# Patient Record
Sex: Male | Born: 1960 | Race: White | Hispanic: No | Marital: Single | Smoking: Current every day smoker
Health system: Southern US, Community
[De-identification: ages and names within clinical notes are randomized; demographics above are authoritative.]

## PROBLEM LIST (undated history)

## (undated) DIAGNOSIS — E7800 Pure hypercholesterolemia, unspecified: Secondary | ICD-10-CM

## (undated) DIAGNOSIS — I639 Cerebral infarction, unspecified: Secondary | ICD-10-CM

## (undated) HISTORY — PX: INCISE AND DRAIN ABCESS: PRO64

---

## 2001-04-24 ENCOUNTER — Emergency Department (HOSPITAL_COMMUNITY): Admission: EM | Admit: 2001-04-24 | Discharge: 2001-04-24 | Payer: Self-pay | Admitting: Emergency Medicine

## 2005-06-19 ENCOUNTER — Emergency Department (HOSPITAL_COMMUNITY): Admission: EM | Admit: 2005-06-19 | Discharge: 2005-06-19 | Payer: Self-pay | Admitting: Emergency Medicine

## 2017-01-31 ENCOUNTER — Encounter (HOSPITAL_COMMUNITY): Payer: Self-pay | Admitting: Emergency Medicine

## 2017-01-31 ENCOUNTER — Emergency Department (HOSPITAL_COMMUNITY)
Admission: EM | Admit: 2017-01-31 | Discharge: 2017-01-31 | Disposition: A | Discharge status: Home / Self Care | Payer: Self-pay | Attending: Emergency Medicine | Admitting: Emergency Medicine

## 2017-01-31 DIAGNOSIS — K4090 Unilateral inguinal hernia, without obstruction or gangrene, not specified as recurrent: Secondary | ICD-10-CM

## 2017-01-31 DIAGNOSIS — K4091 Unilateral inguinal hernia, without obstruction or gangrene, recurrent: Secondary | ICD-10-CM | POA: Insufficient documentation

## 2017-01-31 DIAGNOSIS — F17210 Nicotine dependence, cigarettes, uncomplicated: Secondary | ICD-10-CM | POA: Insufficient documentation

## 2017-01-31 LAB — URINALYSIS, ROUTINE W REFLEX MICROSCOPIC
Bilirubin Urine: NEGATIVE
Glucose, UA: NEGATIVE mg/dL
Hgb urine dipstick: NEGATIVE
KETONES UR: NEGATIVE mg/dL
LEUKOCYTES UA: NEGATIVE
NITRITE: NEGATIVE
PROTEIN: NEGATIVE mg/dL
Specific Gravity, Urine: 1.003 — ABNORMAL LOW (ref 1.005–1.030)
pH: 6 (ref 5.0–8.0)

## 2017-01-31 MED ORDER — NAPROXEN 500 MG PO TABS: 500.0000 mg | ORAL_TABLET | Freq: Two times a day (BID) | ORAL | 0 refills | Status: DC

## 2017-01-31 NOTE — ED Notes (Signed)
Pt made aware to return if symptoms worsen or if any life threatening symptoms occur.   

## 2017-01-31 NOTE — ED Triage Notes (Signed)
PT states he started having right groin pain x4 weeks ago after helping a friend move. PT states he was to follow up with the health department and went there this am and was told to come to ED to rule out hernia. PT also states increased urinary freq x2 weeks.

## 2017-02-01 NOTE — ED Provider Notes (Signed)
[facility] DEPT Provider Note   CSN: [id number] Arrival date & time: 01/31/17  0848     History   Chief Complaint Chief Complaint  Patient presents with  . Groin Pain    HPI Jonathon Yates is a 56 y.o. male presenting with intermittent pain and swelling in his right groin which started after helping a friend move, had lifted several heavy objects.  His pain is aching, sometimes sharp and is worsened with coughing, sneezing, bending over, with swelling being intermittent and also worse when standing.  He was seen by the health department and referred here to assess for a possible hernia.  He denies penile or scrotal pain, denies hematuria, dysuria, but does endorse increased urinary frequency for the past 3 weeks.  No n/v/d, fevers or back pain.  Rest improves the symptoms.  The history is provided by the patient.    History reviewed. No pertinent past medical history.  There are no active problems to display for this patient.   Past Surgical History:  Procedure Laterality Date  . INCISE AND DRAIN ABCESS         Home Medications    Prior to Admission medications   Medication Sig Start Date End Date Taking? Authorizing Provider  Aspirin-Salicylamide-Caffeine (BC HEADACHE PO) Take 1-2 packets by mouth daily as needed (headache).   Yes Historical Provider, MD  naproxen (NAPROSYN) 500 MG tablet Take 1 tablet (500 mg total) by mouth 2 (two) times daily. 01/31/17   Evalee Jefferson, PA-C    Family History No family history on file.  Social History Social History  Substance Use Topics  . Smoking status: Current Every Day Smoker    Packs/day: 0.50    Types: Cigarettes  . Smokeless tobacco: Never Used  . Alcohol use Yes     Comment: occ     Allergies   Patient has no known allergies.   Review of Systems Review of Systems  Constitutional: Negative for fever.  HENT: Negative for congestion and sore throat.   Eyes: Negative.   Respiratory: Negative for chest  tightness and shortness of breath.   Cardiovascular: Negative for chest pain.  Gastrointestinal: Positive for abdominal distention and abdominal pain. Negative for constipation, nausea and vomiting.  Genitourinary: Positive for frequency. Negative for difficulty urinating, dysuria and hematuria.  Musculoskeletal: Negative for arthralgias, joint swelling and neck pain.  Skin: Negative.  Negative for rash and wound.  Neurological: Negative for dizziness, weakness, light-headedness, numbness and headaches.  Psychiatric/Behavioral: Negative.      Physical Exam Updated Vital Signs BP (!) 143/88 (BP Location: Left Arm)   Pulse (!) 58   Temp 97.6 F (36.4 C) (Oral)   Resp 16   Ht 5\' 9"  (1.753 m)   Wt 80.3 kg   SpO2 99%   BMI 26.14 kg/m   Physical Exam  Constitutional: He appears well-developed and well-nourished.  HENT:  Head: Normocephalic and atraumatic.  Eyes: Conjunctivae are normal.  Neck: Normal range of motion.  Cardiovascular: Normal rate, regular rhythm, normal heart sounds and intact distal pulses.   Pulmonary/Chest: Effort normal and breath sounds normal. He has no wheezes.  Abdominal: Soft. Bowel sounds are normal. There is no tenderness. A hernia is present. Hernia confirmed positive in the right inguinal area.  Right direct inguinal hernia which is easily reducible with gentle pressure.  Genitourinary: Testes normal and penis normal.  Musculoskeletal: Normal range of motion.  Neurological: He is alert.  Skin: Skin is warm and dry.  Psychiatric: He  has a normal mood and affect.  Nursing note and vitals reviewed.  Chaperone was present during exam.    ED Treatments / Results  Labs (all labs ordered are listed, but only abnormal results are displayed) Labs Reviewed  URINALYSIS, ROUTINE W REFLEX MICROSCOPIC - Abnormal; Notable for the following:       Result Value   Color, Urine STRAW (*)    Specific Gravity, Urine 1.003 (*)    All other components within  normal limits    EKG  EKG Interpretation None       Radiology No results found.  Procedures Procedures (including critical care time)  Medications Ordered in ED Medications - No data to display   Initial Impression / Assessment and Plan / ED Course  I have reviewed the triage vital signs and the nursing notes.  Pertinent labs & imaging results that were available during my care of the patient were reviewed by me and considered in my medical decision making (see chart for details).     Pt referred to gen surg.  No acute findings today, easily reducible right direct inguinal hernia without evidence of incarceration.  Discussed strict return precautions.    The patient appears reasonably screened and/or stabilized for discharge and I doubt any other medical condition or other [facility] requiring further screening, evaluation, or treatment in the ED at this time prior to discharge.   Final Clinical Impressions(s) / ED Diagnoses   Final diagnoses:  Inguinal hernia of right side without obstruction or gangrene    New Prescriptions Discharge Medication List as of 01/31/2017 11:07 AM    START taking these medications   Details  naproxen (NAPROSYN) 500 MG tablet Take 1 tablet (500 mg total) by mouth 2 (two) times daily., Starting Mon 01/31/2017, Print         Evalee Jefferson, PA-C 02/01/17 [place], DO 02/04/17 1658

## 2017-02-17 ENCOUNTER — Encounter: Payer: Self-pay | Admitting: General Surgery

## 2017-02-17 ENCOUNTER — Ambulatory Visit (INDEPENDENT_AMBULATORY_CARE_PROVIDER_SITE_OTHER): Payer: Self-pay | Admitting: General Surgery

## 2017-02-17 VITALS — BP 122/63 | HR 62 | Temp 98.2°F | Resp 18 | Ht 69.0 in | Wt 197.0 lb

## 2017-02-17 DIAGNOSIS — K4090 Unilateral inguinal hernia, without obstruction or gangrene, not specified as recurrent: Secondary | ICD-10-CM

## 2017-02-17 NOTE — Patient Instructions (Signed)

## 2017-02-17 NOTE — H&P (Signed)
Jonathon Yates; [id number]; 1961/02/14   HPI Patient is a 56 year old white male who was referred by the emergency room for evaluation treatment of her right inguinal hernia. He was moving a refrigerator with a dolly and had a sudden onset of right groin pain. He noticed a lump that was present. The lump comes and goes. It is made worse with straining. It does reduce when he lies back flat. His pain level is 8 out of 10 when it is sticking out. The pain radiates to the right inner thigh and right testicle. No past medical history on file.       Past Surgical History:  Procedure Laterality Date  . INCISE AND DRAIN ABCESS      No family history on file.        Current Outpatient Prescriptions on File Prior to Visit  Medication Sig Dispense Refill  . Aspirin-Salicylamide-Caffeine (BC HEADACHE PO) Take 1-2 packets by mouth daily as needed (headache).    . naproxen (NAPROSYN) 500 MG tablet Take 1 tablet (500 mg total) by mouth 2 (two) times daily. 30 tablet 0   No current facility-administered medications on file prior to visit.     No Known Allergies      History  Alcohol Use  . Yes    Comment: occ        History  Smoking Status  . Current Every Day Smoker  . Packs/day: 0.50  . Types: Cigarettes  Smokeless Tobacco  . Never Used    Review of Systems  Constitutional: Negative.   HENT: Positive for sinus pain.   Eyes: Negative.   Respiratory: Negative.   Cardiovascular: Negative.   Gastrointestinal: Negative.   Genitourinary: Negative.   Musculoskeletal: Positive for back pain, joint pain and neck pain.  Skin: Negative.   Neurological: Negative.   Endo/Heme/Allergies: Negative.   Psychiatric/Behavioral: Negative.     Objective      Vitals:   02/17/17 1032  BP: 122/63  Pulse: 62  Resp: 18  Temp: 98.2 F (36.8 C)    Physical Exam  Constitutional: He is oriented to person, place, and time and well-developed, well-nourished, and in  no distress.  HENT:  Head: Normocephalic and atraumatic.  Neck: Normal range of motion. Neck supple.  Cardiovascular: Normal rate, regular rhythm and normal heart sounds.   No murmur heard. Pulmonary/Chest: Effort normal and breath sounds normal. He has no wheezes. He has no rales.  Abdominal: Soft. Bowel sounds are normal. He exhibits no distension. There is no tenderness.  Easily reducible right inguinal hernia is present.  Genitourinary:  Genitourinary Comments: Genitourinary examination is within normal limits.  Neurological: He is alert and oriented to person, place, and time.  Skin: Skin is warm and dry.  Vitals reviewed.  ER notes reviewed.  Assessment  Right inguinal hernia Plan   Scheduled for right inguinal herniorrhaphy with mesh on 02/25/2017. The risks and benefits of the procedure including bleeding, infection, mesh use, and the possibility of recurrence of the hernia were fully explained to the patient, who gave informed consent.

## 2017-02-17 NOTE — Progress Notes (Signed)
Jonathon Yates; [id number]; 09/09/1961   HPI Patient is a 56 year old white male who was referred by the emergency room for evaluation treatment of her right inguinal hernia. He was moving a refrigerator with a dolly and had a sudden onset of right groin pain. He noticed a lump that was present. The lump comes and goes. It is made worse with straining. It does reduce when he lies back flat. His pain level is 8 out of 10 when it is sticking out. The pain radiates to the right inner thigh and right testicle. No past medical history on file.  Past Surgical History:  Procedure Laterality Date  . INCISE AND DRAIN ABCESS      No family history on file.  Current Outpatient Prescriptions on File Prior to Visit  Medication Sig Dispense Refill  . Aspirin-Salicylamide-Caffeine (BC HEADACHE PO) Take 1-2 packets by mouth daily as needed (headache).    . naproxen (NAPROSYN) 500 MG tablet Take 1 tablet (500 mg total) by mouth 2 (two) times daily. 30 tablet 0   No current facility-administered medications on file prior to visit.     No Known Allergies  History  Alcohol Use  . Yes    Comment: occ    History  Smoking Status  . Current Every Day Smoker  . Packs/day: 0.50  . Types: Cigarettes  Smokeless Tobacco  . Never Used    Review of Systems  Constitutional: Negative.   HENT: Positive for sinus pain.   Eyes: Negative.   Respiratory: Negative.   Cardiovascular: Negative.   Gastrointestinal: Negative.   Genitourinary: Negative.   Musculoskeletal: Positive for back pain, joint pain and neck pain.  Skin: Negative.   Neurological: Negative.   Endo/Heme/Allergies: Negative.   Psychiatric/Behavioral: Negative.     Objective   Vitals:   02/17/17 1032  BP: 122/63  Pulse: 62  Resp: 18  Temp: 98.2 F (36.8 C)    Physical Exam  Constitutional: He is oriented to person, place, and time and well-developed, well-nourished, and in no distress.  HENT:  Head: Normocephalic and  atraumatic.  Neck: Normal range of motion. Neck supple.  Cardiovascular: Normal rate, regular rhythm and normal heart sounds.   No murmur heard. Pulmonary/Chest: Effort normal and breath sounds normal. He has no wheezes. He has no rales.  Abdominal: Soft. Bowel sounds are normal. He exhibits no distension. There is no tenderness.  Easily reducible right inguinal hernia is present.  Genitourinary:  Genitourinary Comments: Genitourinary examination is within normal limits.  Neurological: He is alert and oriented to person, place, and time.  Skin: Skin is warm and dry.  Vitals reviewed.  ER notes reviewed.  Assessment  Right inguinal hernia Plan   Scheduled for right inguinal herniorrhaphy with mesh on 02/25/2017. The risks and benefits of the procedure including bleeding, infection, mesh use, and the possibility of recurrence of the hernia were fully explained to the patient, who gave informed consent.

## 2017-02-18 NOTE — Patient Instructions (Addendum)
Jonathon Yates  02/18/2017     @PREFPERIOPPHARMACY @   Your procedure is scheduled on 02/25/2017.  Report to [facility] at 8:00 A.M.  Call this number if you have problems the morning of surgery:  [phone]   Remember:  Do not eat food or drink liquids after midnight.  Take these medicines the morning of surgery with A SIP OF WATER : none   Do not wear jewelry, make-up or nail polish.  Do not wear lotions, powders, or perfumes, or deoderant.  Do not shave 48 hours prior to surgery.  Men may shave face and neck.  Do not bring valuables to the [facility].  [facility] is not responsible for any belongings or valuables.  Contacts, dentures or bridgework may not be worn into surgery.  Leave your suitcase in the car.  After surgery it may be brought to your room.  For patients admitted to the [facility], discharge time will be determined by your treatment team.  Patients discharged the day of surgery will not be allowed to drive home.   Name and phone number of your driver:   family Special instructions:  n/a  Please read over the following fact sheets that you were given. Care and Recovery After Surgery       Open Hernia Repair, Adult Open hernia repair is a surgical procedure to fix a hernia. A hernia occurs when an internal organ or tissue pushes out through a weak spot in the abdominal wall muscles. Hernias commonly occur in the groin and around the navel. Most hernias tend to get worse over time. Often, surgery is done to prevent the hernia from becoming bigger, uncomfortable, or an emergency. Emergency surgery may be needed if abdominal contents get stuck in the opening (incarcerated hernia) or the blood supply gets cut off (strangulated hernia). In an open repair, an incision is made in the abdomen to perform the surgery. Tell a health care provider about:  Any allergies you have.  All medicines you are taking, including vitamins, herbs, eye drops, creams, and  over-the-counter medicines.  Any problems you or family members have had with anesthetic medicines.  Any blood or bone disorders you have.  Any surgeries you have had.  Any medical conditions you have, including any recent cold or flu symptoms.  Whether you are pregnant or may be pregnant. What are the risks? Generally, this is a safe procedure. However, problems may occur, including:  Long-lasting (chronic) pain.  Bleeding.  Infection.  Damage to the testicle. This can cause shrinking or swelling.  Damage to the bladder, blood vessels, intestine, or nerves near the hernia.  Trouble passing urine.  Allergic reactions to medicines.  Return of the hernia. What happens before the procedure? Staying hydrated  Follow instructions from your health care provider about hydration, which may include:  Up to 2 hours before the procedure - you may continue to drink clear liquids, such as water, clear fruit juice, black coffee, and plain tea. Eating and drinking restrictions  Follow instructions from your health care provider about eating and drinking, which may include:  8 hours before the procedure - stop eating heavy meals or foods such as meat, fried foods, or fatty foods.  6 hours before the procedure - stop eating light meals or foods, such as toast or cereal.  6 hours before the procedure - stop drinking milk or drinks that contain milk.  2 hours before the procedure - stop drinking clear liquids. Medicines   Ask  your health care provider about:  Changing or stopping your regular medicines. This is especially important if you are taking diabetes medicines or blood thinners.  Taking medicines such as aspirin and ibuprofen. These medicines can thin your blood. Do not take these medicines before your procedure if your health care provider instructs you not to.  You may be given antibiotic medicine to help prevent infection. General instructions   You may have blood tests  or imaging studies.  Ask your health care provider how your surgical site will be marked or identified.  If you smoke, do not smoke for at least 2 weeks before your procedure or for as long as told by your health care provider.  Let your health care provider know if you develop a cold or any infection before your surgery.  Plan to have someone take you home from the [facility] or clinic.  If you will be going home right after the procedure, plan to have someone with you for 24 hours. What happens during the procedure?  To reduce your risk of infection:  Your health care team will wash or sanitize their hands.  Your skin will be washed with soap.  Hair may be removed from the surgical area.  An IV tube will be inserted into one of your veins.  You will be given one or more of the following:  A medicine to help you relax (sedative).  A medicine to numb the area (local anesthetic).  A medicine to make you fall asleep (general anesthetic).  Your surgeon will make an incision over the hernia.  The tissues of the hernia will be moved back into place.  The edges of the hernia may be stitched together.  The opening in the abdominal muscles will be closed with stitches (sutures). Or, your surgeon will place a mesh patch made of manmade (synthetic) material over the opening.  The incision will be closed.  A bandage (dressing) may be placed over the incision. The procedure may vary among health care providers and hospitals. What happens after the procedure?  Your blood pressure, heart rate, breathing rate, and blood oxygen level will be monitored until the medicines you were given have worn off.  You may be given medicine for pain.  Do not drive for 24 hours if you received a sedative. This information is not intended to replace advice given to you by your health care provider. Make sure you discuss any questions you have with your health care provider. Document Released:  04/20/2001 Document Revised: 05/14/2016 Document Reviewed: 04/07/2016 Elsevier Interactive Patient Education  2017 [employer].

## 2017-02-21 ENCOUNTER — Ambulatory Visit: Payer: Self-pay | Admitting: Physician Assistant

## 2017-02-21 ENCOUNTER — Encounter: Payer: Self-pay | Admitting: Physician Assistant

## 2017-02-21 ENCOUNTER — Other Ambulatory Visit: Payer: Self-pay | Admitting: Physician Assistant

## 2017-02-21 VITALS — BP 148/68 | HR 66 | Temp 98.1°F | Ht 68.75 in | Wt 196.0 lb

## 2017-02-21 DIAGNOSIS — Z1211 Encounter for screening for malignant neoplasm of colon: Secondary | ICD-10-CM

## 2017-02-21 DIAGNOSIS — Z131 Encounter for screening for diabetes mellitus: Secondary | ICD-10-CM

## 2017-02-21 DIAGNOSIS — F17210 Nicotine dependence, cigarettes, uncomplicated: Secondary | ICD-10-CM | POA: Insufficient documentation

## 2017-02-21 DIAGNOSIS — Z13220 Encounter for screening for lipoid disorders: Secondary | ICD-10-CM

## 2017-02-21 DIAGNOSIS — Z125 Encounter for screening for malignant neoplasm of prostate: Secondary | ICD-10-CM

## 2017-02-21 DIAGNOSIS — R030 Elevated blood-pressure reading, without diagnosis of hypertension: Secondary | ICD-10-CM

## 2017-02-21 LAB — GLUCOSE, POCT (MANUAL RESULT ENTRY): POC GLUCOSE: 94 mg/dL (ref 70–99)

## 2017-02-21 NOTE — Progress Notes (Signed)
BP (!) 148/68 (BP Location: Left Arm, Patient Position: Sitting, Cuff Size: Large)   Pulse 66   Temp 98.1 F (36.7 C)   Ht 5' 8.75" (1.746 m)   Wt 196 lb (88.9 kg)   SpO2 99%   BMI 29.16 kg/m    Subjective:    Patient ID: Jonathon Yates, male    DOB: 1961-05-01, 56 y.o.   MRN: [id number]  HPI: Jonathon Yates is a 56 y.o. male presenting on 02/21/2017 for New Patient (Initial Visit) and Hernia (pt states getting surgery done on friday. pt seeing Dr. Arnoldo Morale)   HPI    Chief Complaint  Patient presents with  . New Patient (Initial Visit)  . Hernia    pt states getting surgery done on friday. pt seeing Dr. Arnoldo Morale    pt is scheduled for hernia surgery on Friday with Dr Arnoldo Morale   bp mildly elevated today and also on 01/31/17 in the ER (was 143/88)   Pt hasn't had PCP since he was a child.   Pt says he is feeling well  Relevant past medical, surgical, family and social history reviewed and updated as indicated. Interim medical history since our last visit reviewed. Allergies and medications reviewed and updated.  No current outpatient prescriptions on file.   Review of Systems  Constitutional: Positive for fatigue. Negative for appetite change, chills, diaphoresis, fever and unexpected weight change.  HENT: Negative for congestion, dental problem, drooling, ear pain, facial swelling, hearing loss, mouth sores, sneezing, sore throat, trouble swallowing and voice change.   Eyes: Negative for pain, discharge, redness, itching and visual disturbance.  Respiratory: Negative for cough, choking, shortness of breath and wheezing.   Cardiovascular: Negative for chest pain, palpitations and leg swelling.  Gastrointestinal: Negative for abdominal pain, blood in stool, constipation, diarrhea and vomiting.  Endocrine: Negative for cold intolerance, heat intolerance and polydipsia.  Genitourinary: Negative for decreased urine volume, dysuria and hematuria.  Musculoskeletal:  Negative for arthralgias, back pain and gait problem.  Skin: Negative for rash.  Allergic/Immunologic: Negative for environmental allergies.  Neurological: Positive for headaches. Negative for seizures, syncope and light-headedness.  Hematological: Negative for adenopathy.  Psychiatric/Behavioral: Negative for agitation, dysphoric mood and suicidal ideas. The patient is not nervous/anxious.     Per HPI unless specifically indicated above     Objective:    BP (!) 148/68 (BP Location: Left Arm, Patient Position: Sitting, Cuff Size: Large)   Pulse 66   Temp 98.1 F (36.7 C)   Ht 5' 8.75" (1.746 m)   Wt 196 lb (88.9 kg)   SpO2 99%   BMI 29.16 kg/m   Wt Readings from Last 3 Encounters:  02/21/17 196 lb (88.9 kg)  02/17/17 197 lb (89.4 kg)  01/31/17 177 lb (80.3 kg)    Physical Exam  Constitutional: He is oriented to person, place, and time. He appears well-developed and well-nourished.  HENT:  Head: Normocephalic and atraumatic.  Mouth/Throat: Oropharynx is clear and moist. No oropharyngeal exudate.  Eyes: Conjunctivae and EOM are normal. Pupils are equal, round, and reactive to light.  Neck: Neck supple. No thyromegaly present.  Cardiovascular: Normal rate and regular rhythm.   Pulmonary/Chest: Effort normal and breath sounds normal. He has no wheezes. He has no rales.  Abdominal: Soft. Bowel sounds are normal. He exhibits no mass. There is no hepatosplenomegaly. There is no tenderness.  Musculoskeletal: He exhibits no edema.  Lymphadenopathy:    He has no cervical adenopathy.  Neurological: He is alert  and oriented to person, place, and time.  Skin: Skin is warm and dry. No rash noted.  Psychiatric: He has a normal mood and affect. His behavior is normal. Thought content normal.  Vitals reviewed.   Results for orders placed or performed in visit on 02/21/17  POCT Glucose (CBG)  Result Value Ref Range   POC Glucose 94 70 - 99 mg/dl      Assessment & Plan:     Encounter Diagnoses  Name Primary?  . Elevated blood pressure reading in office without diagnosis of hypertension Yes  . Cigarette nicotine dependence without complication   . Screening for diabetes mellitus   . Screening cholesterol level   . Screening for prostate cancer   . Special screening for malignant neoplasm of colon     -Get baseline labs.  Will not repeat the tests being done by Dr Arnoldo Morale -follow up 1 month to recheck the bp and review labs -Counseled smoking cessation -Gave ifobt for colon cancer screening

## 2017-02-21 NOTE — Patient Instructions (Signed)
Steps to Quit Smoking Smoking tobacco can be harmful to your health and can affect almost every organ in your body. Smoking puts you, and those around you, at risk for developing many serious chronic diseases. Quitting smoking is difficult, but it is one of the best things that you can do for your health. It is never too late to quit. What are the benefits of quitting smoking? When you quit smoking, you lower your risk of developing serious diseases and conditions, such as:  Lung cancer or lung disease, such as COPD.  Heart disease.  Stroke.  Heart attack.  Infertility.  Osteoporosis and bone fractures.  Additionally, symptoms such as coughing, wheezing, and shortness of breath may get better when you quit. You may also find that you get sick less often because your body is stronger at fighting off colds and infections. If you are pregnant, quitting smoking can help to reduce your chances of having a baby of low birth weight. How do I get ready to quit? When you decide to quit smoking, create a plan to make sure that you are successful. Before you quit:  Pick a date to quit. Set a date within the next two weeks to give you time to prepare.  Write down the reasons why you are quitting. Keep this list in places where you will see it often, such as on your bathroom mirror or in your car or wallet.  Identify the people, places, things, and activities that make you want to smoke (triggers) and avoid them. Make sure to take these actions: ? Throw away all cigarettes at home, at work, and in your car. ? Throw away smoking accessories, such as ashtrays and lighters. ? Clean your car and make sure to empty the ashtray. ? Clean your home, including curtains and carpets.  Tell your family, friends, and coworkers that you are quitting. Support from your loved ones can make quitting easier.  Talk with your health care provider about your options for quitting smoking.  Find out what treatment  options are covered by your health insurance.  What strategies can I use to quit smoking? Talk with your healthcare provider about different strategies to quit smoking. Some strategies include:  Quitting smoking altogether instead of gradually lessening how much you smoke over a period of time. Research shows that quitting "cold turkey" is more successful than gradually quitting.  Attending in-person counseling to help you build problem-solving skills. You are more likely to have success in quitting if you attend several counseling sessions. Even short sessions of 10 minutes can be effective.  Finding resources and support systems that can help you to quit smoking and remain smoke-free after you quit. These resources are most helpful when you use them often. They can include: ? Online chats with a counselor. ? Telephone quitlines. ? Printed self-help materials. ? Support groups or group counseling. ? Text messaging programs. ? Mobile phone applications.  Taking medicines to help you quit smoking. (If you are pregnant or breastfeeding, talk with your health care provider first.) Some medicines contain nicotine and some do not. Both types of medicines help with cravings, but the medicines that include nicotine help to relieve withdrawal symptoms. Your health care provider may recommend: ? Nicotine patches, gum, or lozenges. ? Nicotine inhalers or sprays. ? Non-nicotine medicine that is taken by mouth.  Talk with your health care provider about combining strategies, such as taking medicines while you are also receiving in-person counseling. Using these two strategies together   makes you more likely to succeed in quitting than if you used either strategy on its own. If you are pregnant or breastfeeding, talk with your health care provider about finding counseling or other support strategies to quit smoking. Do not take medicine to help you quit smoking unless told to do so by your health care  provider. What things can I do to make it easier to quit? Quitting smoking might feel overwhelming at first, but there is a lot that you can do to make it easier. Take these important actions:  Reach out to your family and friends and ask that they support and encourage you during this time. Call telephone quitlines, reach out to support groups, or work with a counselor for support.  Ask people who smoke to avoid smoking around you.  Avoid places that trigger you to smoke, such as bars, parties, or smoke-break areas at work.  Spend time around people who do not smoke.  Lessen stress in your life, because stress can be a smoking trigger for some people. To lessen stress, try: ? Exercising regularly. ? Deep-breathing exercises. ? Yoga. ? Meditating. ? Performing a body scan. This involves closing your eyes, scanning your body from head to toe, and noticing which parts of your body are particularly tense. Purposefully relax the muscles in those areas.  Download or purchase mobile phone or tablet apps (applications) that can help you stick to your quit plan by providing reminders, tips, and encouragement. There are many free apps, such as QuitGuide from the CDC (Centers for Disease Control and Prevention). You can find other support for quitting smoking (smoking cessation) through smokefree.gov and other websites.  How will I feel when I quit smoking? Within the first 24 hours of quitting smoking, you may start to feel some withdrawal symptoms. These symptoms are usually most noticeable 2-3 days after quitting, but they usually do not last beyond 2-3 weeks. Changes or symptoms that you might experience include:  Mood swings.  Restlessness, anxiety, or irritation.  Difficulty concentrating.  Dizziness.  Strong cravings for sugary foods in addition to nicotine.  Mild weight gain.  Constipation.  Nausea.  Coughing or a sore throat.  Changes in how your medicines work in your  body.  A depressed mood.  Difficulty sleeping (insomnia).  After the first 2-3 weeks of quitting, you may start to notice more positive results, such as:  Improved sense of smell and taste.  Decreased coughing and sore throat.  Slower heart rate.  Lower blood pressure.  Clearer skin.  The ability to breathe more easily.  Fewer sick days.  Quitting smoking is very challenging for most people. Do not get discouraged if you are not successful the first time. Some people need to make many attempts to quit before they achieve long-term success. Do your best to stick to your quit plan, and talk with your health care provider if you have any questions or concerns. This information is not intended to replace advice given to you by your health care provider. Make sure you discuss any questions you have with your health care provider. Document Released: 10/19/2001 Document Revised: 06/22/2016 Document Reviewed: 03/11/2015 Elsevier Interactive Patient Education  2017 [employer]  

## 2017-02-22 ENCOUNTER — Encounter (HOSPITAL_COMMUNITY): Payer: Self-pay

## 2017-02-22 ENCOUNTER — Encounter (HOSPITAL_COMMUNITY)
Admission: RE | Admit: 2017-02-22 | Discharge: 2017-02-22 | Disposition: A | Discharge status: Home / Self Care | Payer: Self-pay | Source: Ambulatory Visit | Attending: General Surgery | Admitting: General Surgery

## 2017-02-22 DIAGNOSIS — Z01812 Encounter for preprocedural laboratory examination: Secondary | ICD-10-CM | POA: Insufficient documentation

## 2017-02-22 DIAGNOSIS — Z01810 Encounter for preprocedural cardiovascular examination: Secondary | ICD-10-CM | POA: Insufficient documentation

## 2017-02-22 DIAGNOSIS — K4090 Unilateral inguinal hernia, without obstruction or gangrene, not specified as recurrent: Secondary | ICD-10-CM | POA: Insufficient documentation

## 2017-02-22 LAB — HEPATIC FUNCTION PANEL
ALT: 9 U/L (ref 9–46)
AST: 14 U/L (ref 10–35)
Albumin: 3.9 g/dL (ref 3.6–5.1)
Alkaline Phosphatase: 53 U/L (ref 40–115)
BILIRUBIN DIRECT: 0.1 mg/dL (ref <=0.2)
BILIRUBIN INDIRECT: 0.6 mg/dL (ref 0.2–1.2)
BILIRUBIN TOTAL: 0.7 mg/dL (ref 0.2–1.2)
Total Protein: 7 g/dL (ref 6.1–8.1)

## 2017-02-22 LAB — BASIC METABOLIC PANEL
ANION GAP: 7 (ref 5–15)
BUN: 11 mg/dL (ref 6–20)
CO2: 27 mmol/L (ref 22–32)
Calcium: 9.1 mg/dL (ref 8.9–10.3)
Chloride: 104 mmol/L (ref 101–111)
Creatinine, Ser: 1.14 mg/dL (ref 0.61–1.24)
GFR calc Af Amer: >60 mL/min (ref >60)
Glucose, Bld: 84 mg/dL (ref 65–99)
POTASSIUM: 3.8 mmol/L (ref 3.5–5.1)
SODIUM: 138 mmol/L (ref 135–145)

## 2017-02-22 LAB — LIPID PANEL
Cholesterol: 201 mg/dL — ABNORMAL HIGH (ref <200)
HDL: 46 mg/dL (ref >40)
LDL CALC: 136 mg/dL — AB (ref <100)
TRIGLYCERIDES: 96 mg/dL (ref <150)
Total CHOL/HDL Ratio: 4.4 Ratio (ref <5.0)
VLDL: 19 mg/dL (ref <30)

## 2017-02-22 LAB — CBC WITH DIFFERENTIAL/PLATELET
BASOS ABS: 0 10*3/uL (ref 0.0–0.1)
Basophils Relative: 1 %
Eosinophils Absolute: 0.1 10*3/uL (ref 0.0–0.7)
Eosinophils Relative: 2 %
HEMATOCRIT: 41.3 % (ref 39.0–52.0)
Hemoglobin: 14.3 g/dL (ref 13.0–17.0)
LYMPHS PCT: 46 %
Lymphs Abs: 2.7 10*3/uL (ref 0.7–4.0)
MCH: 31.1 pg (ref 26.0–34.0)
MCHC: 34.6 g/dL (ref 30.0–36.0)
MCV: 89.8 fL (ref 78.0–100.0)
Monocytes Absolute: 0.7 10*3/uL (ref 0.1–1.0)
Monocytes Relative: 12 %
NEUTROS ABS: 2.3 10*3/uL (ref 1.7–7.7)
Neutrophils Relative %: 39 %
Platelets: 174 10*3/uL (ref 150–400)
RBC: 4.6 MIL/uL (ref 4.22–5.81)
RDW: 13.5 % (ref 11.5–15.5)
WBC: 5.8 10*3/uL (ref 4.0–10.5)

## 2017-02-22 LAB — PSA: PSA: 0.8 ng/mL (ref <=4.0)

## 2017-02-22 LAB — IFOBT (OCCULT BLOOD): IMMUNOLOGICAL FECAL OCCULT BLOOD TEST: NEGATIVE

## 2017-02-23 NOTE — Pre-Procedure Instructions (Signed)
EKG shown to Dr Patsey Berthold. No orders given.

## 2017-02-25 ENCOUNTER — Encounter (HOSPITAL_COMMUNITY): Payer: Self-pay | Admitting: *Deleted

## 2017-02-25 ENCOUNTER — Ambulatory Visit (HOSPITAL_COMMUNITY): Payer: Self-pay | Admitting: Anesthesiology

## 2017-02-25 ENCOUNTER — Encounter (HOSPITAL_COMMUNITY)
Admission: RE | Disposition: A | Discharge status: Home / Self Care | Payer: Self-pay | Source: Ambulatory Visit | Attending: General Surgery

## 2017-02-25 ENCOUNTER — Ambulatory Visit (HOSPITAL_COMMUNITY)
Admission: RE | Admit: 2017-02-25 | Discharge: 2017-02-25 | Disposition: A | Discharge status: Home / Self Care | Payer: Self-pay | Source: Ambulatory Visit | Attending: General Surgery | Admitting: General Surgery

## 2017-02-25 DIAGNOSIS — D176 Benign lipomatous neoplasm of spermatic cord: Secondary | ICD-10-CM | POA: Insufficient documentation

## 2017-02-25 DIAGNOSIS — K4090 Unilateral inguinal hernia, without obstruction or gangrene, not specified as recurrent: Secondary | ICD-10-CM

## 2017-02-25 DIAGNOSIS — F17210 Nicotine dependence, cigarettes, uncomplicated: Secondary | ICD-10-CM | POA: Insufficient documentation

## 2017-02-25 DIAGNOSIS — Z7982 Long term (current) use of aspirin: Secondary | ICD-10-CM | POA: Insufficient documentation

## 2017-02-25 DIAGNOSIS — Z791 Long term (current) use of non-steroidal anti-inflammatories (NSAID): Secondary | ICD-10-CM | POA: Insufficient documentation

## 2017-02-25 DIAGNOSIS — Z9889 Other specified postprocedural states: Secondary | ICD-10-CM | POA: Insufficient documentation

## 2017-02-25 HISTORY — PX: INGUINAL HERNIA REPAIR: SHX194

## 2017-02-25 SURGERY — REPAIR, HERNIA, INGUINAL, ADULT
Anesthesia: General | Site: Groin | Laterality: Right

## 2017-02-25 MED ORDER — ONDANSETRON HCL 4 MG/2ML IJ SOLN
4.0000 mg | Freq: Once | INTRAMUSCULAR | Status: AC
  Administered 2017-02-25: 4 mg via INTRAVENOUS

## 2017-02-25 MED ORDER — CHLORHEXIDINE GLUCONATE CLOTH 2 % EX PADS: 6.0000 | MEDICATED_PAD | Freq: Once | CUTANEOUS | Status: DC

## 2017-02-25 MED ORDER — MIDAZOLAM HCL 2 MG/2ML IJ SOLN
1.0000 mg | INTRAMUSCULAR | Status: AC
  Administered 2017-02-25 (×2): 2 mg via INTRAVENOUS
  Filled 2017-02-25: qty 2

## 2017-02-25 MED ORDER — SODIUM CHLORIDE 0.9 % IR SOLN
Status: DC | PRN
  Administered 2017-02-25: 1000 mL

## 2017-02-25 MED ORDER — ONDANSETRON HCL 4 MG/2ML IJ SOLN
INTRAMUSCULAR | Status: AC
  Filled 2017-02-25: qty 2

## 2017-02-25 MED ORDER — CEFAZOLIN SODIUM-DEXTROSE 2-4 GM/100ML-% IV SOLN
2.0000 g | INTRAVENOUS | Status: AC
  Administered 2017-02-25: 2 g via INTRAVENOUS
  Filled 2017-02-25: qty 100

## 2017-02-25 MED ORDER — OXYCODONE-ACETAMINOPHEN 7.5-325 MG PO TABS: 1.0000 | ORAL_TABLET | ORAL | 0 refills | Status: DC | PRN

## 2017-02-25 MED ORDER — BUPIVACAINE LIPOSOME 1.3 % IJ SUSP
INTRAMUSCULAR | Status: DC | PRN
  Administered 2017-02-25: 20 mL

## 2017-02-25 MED ORDER — FENTANYL CITRATE (PF) 250 MCG/5ML IJ SOLN
INTRAMUSCULAR | Status: AC
  Filled 2017-02-25: qty 5

## 2017-02-25 MED ORDER — KETOROLAC TROMETHAMINE 30 MG/ML IJ SOLN
30.0000 mg | Freq: Once | INTRAMUSCULAR | Status: AC
  Administered 2017-02-25: 30 mg via INTRAVENOUS

## 2017-02-25 MED ORDER — MIDAZOLAM HCL 2 MG/2ML IJ SOLN
INTRAMUSCULAR | Status: AC
  Filled 2017-02-25: qty 2

## 2017-02-25 MED ORDER — FENTANYL CITRATE (PF) 100 MCG/2ML IJ SOLN
INTRAMUSCULAR | Status: DC | PRN
  Administered 2017-02-25 (×2): 25 ug via INTRAVENOUS
  Administered 2017-02-25: 50 ug via INTRAVENOUS
  Administered 2017-02-25 (×2): 25 ug via INTRAVENOUS
  Administered 2017-02-25: 50 ug via INTRAVENOUS

## 2017-02-25 MED ORDER — GLYCOPYRROLATE 0.2 MG/ML IJ SOLN
INTRAMUSCULAR | Status: DC | PRN
  Administered 2017-02-25: 0.2 mg via INTRAVENOUS

## 2017-02-25 MED ORDER — BUPIVACAINE LIPOSOME 1.3 % IJ SUSP
INTRAMUSCULAR | Status: AC
  Filled 2017-02-25: qty 20

## 2017-02-25 MED ORDER — LIDOCAINE HCL (CARDIAC) 10 MG/ML IV SOLN
INTRAVENOUS | Status: DC | PRN
  Administered 2017-02-25: 40 mg via INTRAVENOUS

## 2017-02-25 MED ORDER — KETOROLAC TROMETHAMINE 30 MG/ML IJ SOLN
INTRAMUSCULAR | Status: AC
  Filled 2017-02-25: qty 1

## 2017-02-25 MED ORDER — LACTATED RINGERS IV SOLN
INTRAVENOUS | Status: DC
  Administered 2017-02-25 (×2): via INTRAVENOUS

## 2017-02-25 MED ORDER — PROPOFOL 10 MG/ML IV BOLUS
INTRAVENOUS | Status: DC | PRN
  Administered 2017-02-25: 250 mg via INTRAVENOUS
  Administered 2017-02-25: 25 mg via INTRAVENOUS

## 2017-02-25 MED ORDER — LIDOCAINE HCL (PF) 1 % IJ SOLN
INTRAMUSCULAR | Status: AC
  Filled 2017-02-25: qty 5

## 2017-02-25 MED ORDER — HYDROMORPHONE HCL 1 MG/ML IJ SOLN: 0.2500 mg | INTRAMUSCULAR | Status: DC | PRN

## 2017-02-25 SURGICAL SUPPLY — 39 items
ADH SKN CLS APL DERMABOND .7 (GAUZE/BANDAGES/DRESSINGS) ×1
BAG HAMPER (MISCELLANEOUS) ×3 IMPLANT
CLOTH BEACON ORANGE TIMEOUT ST (SAFETY) ×3 IMPLANT
COVER LIGHT HANDLE STERIS (MISCELLANEOUS) ×6 IMPLANT
DERMABOND ADVANCED (GAUZE/BANDAGES/DRESSINGS) ×2
DERMABOND ADVANCED .7 DNX12 (GAUZE/BANDAGES/DRESSINGS) ×1 IMPLANT
DRAIN PENROSE 18X1/2 LTX STRL (DRAIN) ×3 IMPLANT
ELECT REM PT RETURN 9FT ADLT (ELECTROSURGICAL) ×3
ELECTRODE REM PT RTRN 9FT ADLT (ELECTROSURGICAL) ×1 IMPLANT
GLOVE BIOGEL PI IND STRL 7.0 (GLOVE) ×1 IMPLANT
GLOVE BIOGEL PI INDICATOR 7.0 (GLOVE) ×2
GLOVE SURG SS PI 7.5 STRL IVOR (GLOVE) ×3 IMPLANT
GOWN STRL REUS W/ TWL XL LVL3 (GOWN DISPOSABLE) ×1 IMPLANT
GOWN STRL REUS W/TWL LRG LVL3 (GOWN DISPOSABLE) ×6 IMPLANT
GOWN STRL REUS W/TWL XL LVL3 (GOWN DISPOSABLE) ×3
INST SET MINOR GENERAL (KITS) ×3 IMPLANT
KIT ROOM TURNOVER APOR (KITS) ×3 IMPLANT
MANIFOLD NEPTUNE II (INSTRUMENTS) ×3 IMPLANT
MESH HERNIA 1.6X1.9 PLUG LRG (Mesh General) IMPLANT
MESH HERNIA PLUG LRG (Mesh General) ×2 IMPLANT
NDL HYPO 21X1.5 SAFETY (NEEDLE) ×1 IMPLANT
NEEDLE HYPO 21X1.5 SAFETY (NEEDLE) ×3 IMPLANT
NS IRRIG 1000ML POUR BTL (IV SOLUTION) ×3 IMPLANT
PACK MINOR (CUSTOM PROCEDURE TRAY) ×3 IMPLANT
PAD ARMBOARD 7.5X6 YLW CONV (MISCELLANEOUS) ×3 IMPLANT
SCRUB PCMX 4 OZ (MISCELLANEOUS) ×1 IMPLANT
SET BASIN LINEN APH (SET/KITS/TRAYS/PACK) ×3 IMPLANT
SUT NOVA NAB GS-22 2 2-0 T-19 (SUTURE) ×6 IMPLANT
SUT PROLENE 2 0 SH 30 (SUTURE) IMPLANT
SUT SILK 3 0 (SUTURE)
SUT SILK 3-0 18XBRD TIE 12 (SUTURE) IMPLANT
SUT VIC AB 2-0 CT1 27 (SUTURE) ×3
SUT VIC AB 2-0 CT1 TAPERPNT 27 (SUTURE) ×1 IMPLANT
SUT VIC AB 3-0 SH 27 (SUTURE) ×3
SUT VIC AB 3-0 SH 27X BRD (SUTURE) ×1 IMPLANT
SUT VIC AB 4-0 PS2 27 (SUTURE) ×3 IMPLANT
SUT VICRYL AB 2 0 TIES (SUTURE) ×2 IMPLANT
SUT VICRYL AB 3 0 TIES (SUTURE) IMPLANT
SYR 20CC LL (SYRINGE) ×3 IMPLANT

## 2017-02-25 NOTE — Anesthesia Postprocedure Evaluation (Signed)
Anesthesia Post Note  Patient: Jonathon Yates  Procedure(s) Performed: Procedure(s) (LRB): RIGHT INGUINAL HERNIORRHAPHY WITH MESH (Right)  Patient location during evaluation: PACU Anesthesia Type: General Level of consciousness: awake and alert Pain management: pain level controlled Vital Signs Assessment: post-procedure vital signs reviewed and stable Respiratory status: spontaneous breathing Cardiovascular status: blood pressure returned to baseline Postop Assessment: no signs of nausea or vomiting Anesthetic complications: no     Last Vitals:  Vitals:   02/25/17 1145 02/25/17 1203  BP: 124/80 134/72  Pulse: 61 66  Resp: 10 16  Temp: 36.4 C 36.4 C    Last Pain:  Vitals:   02/25/17 1203  TempSrc: Oral  PainSc:                  Tressie Stalker

## 2017-02-25 NOTE — Discharge Instructions (Signed)
Open Hernia Repair, Adult, Care After °This sheet gives you information about how to care for yourself after your procedure. Your health care provider may also give you more specific instructions. If you have problems or questions, contact your health care provider. °What can I expect after the procedure? °After the procedure, it is common to have: °· Mild discomfort. °· Slight bruising. °· Minor swelling. °· Pain in the abdomen. ° °Follow these instructions at home: °Incision care ° °· Follow instructions from your health care provider about how to take care of your incision area. Make sure you: °? Wash your hands with soap and water before you change your bandage (dressing). If soap and water are not available, use hand sanitizer. °? Change your dressing as told by your health care provider. °? Leave stitches (sutures), skin glue, or adhesive strips in place. These skin closures may need to stay in place for 2 weeks or longer. If adhesive strip edges start to loosen and curl up, you may trim the loose edges. Do not remove adhesive strips completely unless your health care provider tells you to do that. °· Check your incision area every day for signs of infection. Check for: °? More redness, swelling, or pain. °? More fluid or blood. °? Warmth. °? Pus or a bad smell. °Activity °· Do not drive or use heavy machinery while taking prescription pain medicine. Do not drive until your health care provider approves. °· Until your health care provider approves: °? Do not lift anything that is heavier than 10 lb (4.5 kg). °? Do not play contact sports. °· Return to your normal activities as told by your health care provider. Ask your health care provider what activities are safe. °General instructions °· To prevent or treat constipation while you are taking prescription pain medicine, your health care provider may recommend that you: °? Drink enough fluid to keep your urine clear or pale yellow. °? Take over-the-counter or  prescription medicines. °? Eat foods that are high in fiber, such as fresh fruits and vegetables, whole grains, and beans. °? Limit foods that are high in fat and processed sugars, such as fried and sweet foods. °· Take over-the-counter and prescription medicines only as told by your health care provider. °· Do not take tub baths or go swimming until your health care provider approves. °· Keep all follow-up visits as told by your health care provider. This is important. °Contact a health care provider if: °· You develop a rash. °· You have more redness, swelling, or pain around your incision. °· You have more fluid or blood coming from your incision. °· Your incision feels warm to the touch. °· You have pus or a bad smell coming from your incision. °· You have a fever or chills. °· You have blood in your stool (feces). °· You have not had a bowel movement in 2-3 days. °· Your pain is not controlled with medicine. °Get help right away if: °· You have chest pain or shortness of breath. °· You feel light-headed or feel faint. °· You have severe pain. °· You vomit and your pain is worse. °This information is not intended to replace advice given to you by your health care provider. Make sure you discuss any questions you have with your health care provider. °Document Released: 05/14/2005 Document Revised: 05/14/2016 Document Reviewed: 04/07/2016 °Elsevier Interactive Patient Education © 2017 Elsevier Inc. ° °PATIENT INSTRUCTIONS °POST-ANESTHESIA ° °IMMEDIATELY FOLLOWING SURGERY:  Do not drive or operate machinery for the   first twenty four hours after surgery.  Do not make any important decisions for twenty four hours after surgery or while taking narcotic pain medications or sedatives.  If you develop intractable nausea and vomiting or a severe headache please notify your doctor immediately. ° °FOLLOW-UP:  Please make an appointment with your surgeon as instructed. You do not need to follow up with anesthesia unless  specifically instructed to do so. ° °WOUND CARE INSTRUCTIONS (if applicable):  Keep a dry clean dressing on the anesthesia/puncture wound site if there is drainage.  Once the wound has quit draining you may leave it open to air.  Generally you should leave the bandage intact for twenty four hours unless there is drainage.  If the epidural site drains for more than 36-48 hours please call the anesthesia department. ° °QUESTIONS?:  Please feel free to call your physician or the hospital operator if you have any questions, and they will be happy to assist you.    ° ° ° ° °

## 2017-02-25 NOTE — Op Note (Signed)
Patient:  Jonathon Yates  DOB:  05-06-1961  MRN:  [id number]   Preop Diagnosis:  Right inguinal hernia  Postop Diagnosis:  Same  Procedure:  Right internal herniorrhaphy with mesh  Surgeon:  Aviva Signs, M.D.  Anes:  Gen.  Indications:  Patient is a 56 year old white male who presents with a symptomatic right inguinal hernia. The risks and benefits of the procedure including bleeding, infection, mesh use, and the possibility of recurrence of the hernia were fully explained to the patient, who gave informed consent.  Procedure note:  The patient was placed in the supine position. After general anesthesia was administered, the right groin region was prepped and draped using usual sterile technique with Techni-Care. Surgical site confirmation was performed.  An incision was made in the right groin down to the external oblique a (. The ecchymoses was incised to the external ring. A Penrose drain was placed around spermatic cord. The vas deferens was no within the spermatic cord. The ilioinguinal nerve was identified and retracted superiorly from the operative field. A large lipoma of the cord was excised without difficulty. An indirect hernia sac was found. It was freed away from the spermatic cord up to the peritoneal reflection and inverted. A large Bard PerFix plug was then inserted in this region. An onlay patch was then placed along the floor of the inguinal canal and secured superiorly to the conjoined tendon and inferiorly to the shelving edge of Poupart's ligament using 2-0 Novafil interrupted sutures. The internal ring was re-created using a 2-0 Novafil interrupted suture. The external oblique) was reapproximated using a 2-0 Vicryl running suture. The subcutaneous layer was reapproximated using 3-0 Vicryl interrupted sutures. Exparel was instilled into the surrounding wound. The skin was closed using a 4-0 Vicryl subcuticular suture. Dermabond was applied.  All tape and needle counts  were correct at the end of the procedure. The patient was awakened and transferred to PACU in stable condition.  Complications:  None  EBL:  Minimal  Specimen:  None

## 2017-02-25 NOTE — Transfer of Care (Signed)
Immediate Anesthesia Transfer of Care Note  Patient: Jonathon Yates  Procedure(s) Performed: Procedure(s): RIGHT INGUINAL HERNIORRHAPHY WITH MESH (Right)  Patient Location: PACU  Anesthesia Type:General  Level of Consciousness: awake  Airway & Oxygen Therapy: Patient Spontanous Breathing and Patient connected to face mask oxygen  Post-op Assessment: Report given to RN  Post vital signs: Reviewed and stable  Last Vitals:  Vitals:   02/25/17 0920 02/25/17 0925  BP: (!) 104/59 (!) 98/56  Resp: (!) 31 (!) 25  Temp:      Last Pain:  Vitals:   02/25/17 0755  TempSrc: Oral  PainSc: 5       Patients Stated Pain Goal: 9 (49/49/44 [id number])  Complications: No apparent anesthesia complications

## 2017-02-25 NOTE — Anesthesia Procedure Notes (Signed)
Procedure Name: LMA Insertion Date/Time: 02/25/2017 10:07 AM Performed by: Tressie Stalker E Pre-anesthesia Checklist: Patient identified, Patient being monitored, Emergency Drugs available, Timeout performed and Suction available Patient Re-evaluated:Patient Re-evaluated prior to inductionOxygen Delivery Method: Circle System Utilized Preoxygenation: Pre-oxygenation with 100% oxygen Intubation Type: IV induction Ventilation: Mask ventilation without difficulty LMA: LMA inserted LMA Size: 4.0 Number of attempts: 1 Placement Confirmation: positive ETCO2 and breath sounds checked- equal and bilateral

## 2017-02-25 NOTE — Interval H&P Note (Signed)
History and Physical Interval Note:  02/25/2017 9:11 AM  Jonathon Yates  has presented today for surgery, with the diagnosis of right inguinal hernia  The various methods of treatment have been discussed with the patient and family. After consideration of risks, benefits and other options for treatment, the patient has consented to  Procedure(s): HERNIA REPAIR INGUINAL ADULT WITH MESH (Right) as a surgical intervention .  The patient's history has been reviewed, patient examined, no change in status, stable for surgery.  I have reviewed the patient's chart and labs.  Questions were answered to the patient's satisfaction.     Aviva Signs

## 2017-02-25 NOTE — Anesthesia Preprocedure Evaluation (Signed)
Anesthesia Evaluation  Patient identified by MRN, date of birth, ID band Patient awake    Reviewed: Allergy & Precautions, NPO status , Patient's Chart, lab work & pertinent test results  Airway Mallampati: II  TM Distance: >3 FB     Dental  (+) Lower Dentures, Upper Dentures   Pulmonary Current Smoker,    breath sounds clear to auscultation       Cardiovascular negative cardio ROS   Rhythm:Regular Rate:Abnormal     Neuro/Psych    GI/Hepatic negative GI ROS, Neg liver ROS,   Endo/Other  negative endocrine ROS  Renal/GU negative Renal ROS     Musculoskeletal   Abdominal   Peds  Hematology   Anesthesia Other Findings   Reproductive/Obstetrics                             Anesthesia Physical Anesthesia Plan  ASA: II  Anesthesia Plan: General   Post-op Pain Management:    Induction: Intravenous  Airway Management Planned: LMA  Additional Equipment:   Intra-op Plan:   Post-operative Plan: Extubation in OR  Informed Consent: I have reviewed the patients History and Physical, chart, labs and discussed the procedure including the risks, benefits and alternatives for the proposed anesthesia with the patient or authorized representative who has indicated his/her understanding and acceptance.     Plan Discussed with:   Anesthesia Plan Comments:         Anesthesia Quick Evaluation

## 2017-02-28 ENCOUNTER — Encounter (HOSPITAL_COMMUNITY): Payer: Self-pay | Admitting: General Surgery

## 2017-03-08 ENCOUNTER — Ambulatory Visit: Payer: Self-pay | Admitting: General Surgery

## 2017-03-09 ENCOUNTER — Encounter: Payer: Self-pay | Admitting: General Surgery

## 2017-03-09 ENCOUNTER — Ambulatory Visit (INDEPENDENT_AMBULATORY_CARE_PROVIDER_SITE_OTHER): Payer: Self-pay | Admitting: General Surgery

## 2017-03-09 VITALS — BP 102/62 | HR 63 | Temp 98.0°F | Resp 20 | Ht 69.0 in | Wt 198.0 lb

## 2017-03-09 DIAGNOSIS — Z09 Encounter for follow-up examination after completed treatment for conditions other than malignant neoplasm: Secondary | ICD-10-CM

## 2017-03-09 NOTE — Progress Notes (Signed)
Subjective:     Jonathon Yates  Status post recurrent right inguinal herniorrhaphy with mesh. Doing very well. Denies any complaints. Objective:    BP 102/62   Pulse 63   Temp 98 F (36.7 C)   Resp 20   Ht 5\' 9"  (1.753 m)   Wt 198 lb (89.8 kg)   BMI 29.24 kg/m   General:  alert, cooperative and no distress   Abdomen soft, incision healing well. No ecchymosis or swelling present.     Assessment:    Doing well postoperatively.    Plan:  Increase activity as able. Follow-up here as needed.

## 2017-03-23 ENCOUNTER — Encounter: Payer: Self-pay | Admitting: Physician Assistant

## 2017-03-23 ENCOUNTER — Ambulatory Visit: Payer: Self-pay | Admitting: Physician Assistant

## 2017-03-23 VITALS — BP 104/58 | HR 56 | Temp 97.7°F | Ht 69.0 in | Wt 203.0 lb

## 2017-03-23 DIAGNOSIS — E785 Hyperlipidemia, unspecified: Secondary | ICD-10-CM

## 2017-03-23 DIAGNOSIS — F17210 Nicotine dependence, cigarettes, uncomplicated: Secondary | ICD-10-CM

## 2017-03-23 NOTE — Progress Notes (Signed)
BP (!) 104/58 (BP Location: Left Arm, Patient Position: Sitting, Cuff Size: Normal)   Pulse (!) 56   Temp 97.7 F (36.5 C) (Other (Comment))   Ht 5\' 9"  (1.753 m)   Wt 203 lb (92.1 kg)   SpO2 98%   BMI 29.98 kg/m    Subjective:    Patient ID: Jonathon Yates, male    DOB: Sep 01, 1961, 56 y.o.   MRN: [id number]  HPI: Jonathon Yates is a 56 y.o. male presenting on 03/23/2017 for Blood Pressure Check   HPI   Pt is feeling great after having his hernia surgery.   Relevant past medical, surgical, family and social history reviewed and updated as indicated. Interim medical history since our last visit reviewed. Allergies and medications reviewed and updated.   Current Outpatient Prescriptions:  .  oxyCODONE-acetaminophen (PERCOCET) 7.5-325 MG tablet, Take 1-2 tablets by mouth every 4 (four) hours as needed. (Patient not taking: Reported on 03/23/2017), Disp: 50 tablet, Rfl: 0   Review of Systems  Constitutional: Negative for appetite change, chills, diaphoresis, fatigue, fever and unexpected weight change.  HENT: Negative for congestion, dental problem, drooling, ear pain, facial swelling, hearing loss, mouth sores, sneezing, sore throat, trouble swallowing and voice change.   Eyes: Negative for pain, discharge, redness, itching and visual disturbance.  Respiratory: Negative for cough, choking, shortness of breath and wheezing.   Cardiovascular: Negative for chest pain, palpitations and leg swelling.  Gastrointestinal: Negative for abdominal pain, blood in stool, constipation, diarrhea and vomiting.  Endocrine: Negative for cold intolerance, heat intolerance and polydipsia.  Genitourinary: Negative for decreased urine volume, dysuria and hematuria.  Musculoskeletal: Positive for arthralgias. Negative for back pain and gait problem.  Skin: Negative for rash.  Allergic/Immunologic: Negative for environmental allergies.  Neurological: Negative for seizures, syncope, light-headedness  and headaches.  Hematological: Negative for adenopathy.  Psychiatric/Behavioral: Negative for agitation, dysphoric mood and suicidal ideas. The patient is not nervous/anxious.     Per HPI unless specifically indicated above     Objective:    BP (!) 104/58 (BP Location: Left Arm, Patient Position: Sitting, Cuff Size: Normal)   Pulse (!) 56   Temp 97.7 F (36.5 C) (Other (Comment))   Ht 5\' 9"  (1.753 m)   Wt 203 lb (92.1 kg)   SpO2 98%   BMI 29.98 kg/m   Wt Readings from Last 3 Encounters:  03/23/17 203 lb (92.1 kg)  03/09/17 198 lb (89.8 kg)  02/22/17 196 lb (88.9 kg)    Physical Exam  Constitutional: He is oriented to person, place, and time. He appears well-developed and well-nourished.  HENT:  Head: Normocephalic and atraumatic.  Neck: Neck supple.  Cardiovascular: Normal rate and regular rhythm.   Pulmonary/Chest: Effort normal and breath sounds normal. He has no wheezes.  Abdominal: Soft. Bowel sounds are normal. There is no hepatosplenomegaly. There is no tenderness.  Musculoskeletal: He exhibits no edema.  Lymphadenopathy:    He has no cervical adenopathy.  Neurological: He is alert and oriented to person, place, and time.  Skin: Skin is warm and dry.  Psychiatric: He has a normal mood and affect. His behavior is normal.  Vitals reviewed.   Results for orders placed or performed during the hospital encounter of 85/02/77  Basic metabolic panel  Result Value Ref Range   Sodium 138 135 - 145 mmol/L   Potassium 3.8 3.5 - 5.1 mmol/L   Chloride 104 101 - 111 mmol/L   CO2 27 22 - 32 mmol/L  Glucose, Bld 84 65 - 99 mg/dL   BUN 11 6 - 20 mg/dL   Creatinine, Ser 1.14 0.61 - 1.24 mg/dL   Calcium 9.1 8.9 - 10.3 mg/dL   GFR calc non Af Amer >60 >60 mL/min   GFR calc Af Amer >60 >60 mL/min   Anion gap 7 5 - 15  CBC WITH DIFFERENTIAL  Result Value Ref Range   WBC 5.8 4.0 - 10.5 K/uL   RBC 4.60 4.22 - 5.81 MIL/uL   Hemoglobin 14.3 13.0 - 17.0 g/dL   HCT 41.3 39.0 -  52.0 %   MCV 89.8 78.0 - 100.0 fL   MCH 31.1 26.0 - 34.0 pg   MCHC 34.6 30.0 - 36.0 g/dL   RDW 13.5 11.5 - 15.5 %   Platelets 174 150 - 400 K/uL   Neutrophils Relative % 39 %   Neutro Abs 2.3 1.7 - 7.7 K/uL   Lymphocytes Relative 46 %   Lymphs Abs 2.7 0.7 - 4.0 K/uL   Monocytes Relative 12 %   Monocytes Absolute 0.7 0.1 - 1.0 K/uL   Eosinophils Relative 2 %   Eosinophils Absolute 0.1 0.0 - 0.7 K/uL   Basophils Relative 1 %   Basophils Absolute 0.0 0.0 - 0.1 K/uL      Assessment & Plan:   Encounter Diagnoses  Name Primary?  . Hyperlipidemia, unspecified hyperlipidemia type Yes  . Cigarette nicotine dependence without complication     -reviewed labs with pt (lipids, psa) -Pt to work on [employer] and exercise.  He does not want to start medication for his lipids at this time -counseled Smoking cessation -f/u 4 months. RTO sooner prn

## 2017-03-23 NOTE — Patient Instructions (Signed)
Fat and Cholesterol Restricted Diet High levels of fat and cholesterol in your blood may lead to various health problems, such as diseases of the heart, blood vessels, gallbladder, liver, and pancreas. Fats are concentrated sources of energy that come in various forms. Certain types of fat, including saturated fat, may be harmful in excess. Cholesterol is a substance needed by your body in small amounts. Your body makes all the cholesterol it needs. Excess cholesterol comes from the food you eat. When you have high levels of cholesterol and saturated fat in your blood, health problems can develop because the excess fat and cholesterol will gather along the walls of your blood vessels, causing them to narrow. Choosing the right foods will help you control your intake of fat and cholesterol. This will help keep the levels of these substances in your blood within normal limits and reduce your risk of disease. What is my plan? Your health care provider recommends that you:  Limit your fat intake to ______% or less of your total calories per day.  Limit the amount of cholesterol in your diet to less than _________mg per day.  Eat 20-30 grams of fiber each day.  What types of fat should I choose?  Choose healthy fats more often. Choose monounsaturated and polyunsaturated fats, such as olive and canola oil, flaxseeds, walnuts, almonds, and seeds.  Eat more omega-3 fats. Good choices include salmon, mackerel, sardines, tuna, flaxseed oil, and ground flaxseeds. Aim to eat fish at least two times a week.  Limit saturated fats. Saturated fats are primarily found in animal products, such as meats, butter, and cream. Plant sources of saturated fats include palm oil, palm kernel oil, and coconut oil.  Avoid foods with partially hydrogenated oils in them. These contain trans fats. Examples of foods that contain trans fats are stick margarine, some tub margarines, cookies, crackers, and other baked goods. What  general guidelines do I need to follow? These guidelines for healthy eating will help you control your intake of fat and cholesterol:  Check food labels carefully to identify foods with trans fats or high amounts of saturated fat.  Fill one half of your plate with vegetables and green salads.  Fill one fourth of your plate with whole grains. Look for the word "whole" as the first word in the ingredient list.  Fill one fourth of your plate with lean protein foods.  Limit fruit to two servings a day. Choose fruit instead of juice.  Eat more foods that contain fiber, such as apples, broccoli, carrots, beans, peas, and barley.  Eat more home-cooked food and less restaurant, buffet, and fast food.  Limit or avoid alcohol.  Limit foods high in starch and sugar.  Limit fried foods.  Cook foods using methods other than frying. Baking, boiling, grilling, and broiling are all great options.  Lose weight if you are overweight. Losing just 5-10% of your initial body weight can help your overall health and prevent diseases such as diabetes and heart disease.  What foods can I eat? Grains  Whole grains, such as whole wheat or whole grain breads, crackers, cereals, and pasta. Unsweetened oatmeal, bulgur, barley, quinoa, or brown rice. Corn or whole wheat flour tortillas. Vegetables  Fresh or frozen vegetables (raw, steamed, roasted, or grilled). Green salads. Fruits  All fresh, canned (in natural juice), or frozen fruits. Meats and other protein foods  Ground beef (85% or leaner), grass-fed beef, or beef trimmed of fat. Skinless chicken or turkey. Ground chicken or turkey.   Pork trimmed of fat. All fish and seafood. Eggs. Dried beans, peas, or lentils. Unsalted nuts or seeds. Unsalted canned or dry beans. Dairy  Low-fat dairy products, such as skim or 1% milk, 2% or reduced-fat cheeses, low-fat ricotta or cottage cheese, or plain low-fat yo Fats and oils  Tub margarines without trans  fats. Light or reduced-fat mayonnaise and salad dressings. Avocado. Olive, canola, sesame, or safflower oils. Natural peanut or almond butter (choose ones without added sugar and oil). The items listed above may not be a complete list of recommended foods or beverages. Contact your dietitian for more options. Foods to avoid Grains  White bread. White pasta. White rice. Cornbread. Bagels, pastries, and croissants. Crackers that contain trans fat. Vegetables  White potatoes. Corn. Creamed or fried vegetables. Vegetables in a cheese sauce. Fruits  Dried fruits. Canned fruit in light or heavy syrup. Fruit juice. Meats and other protein foods  Fatty cuts of meat. Ribs, chicken wings, bacon, sausage, bologna, salami, chitterlings, fatback, hot dogs, bratwurst, and packaged luncheon meats. Liver and organ meats. Dairy  Whole or 2% milk, cream, half-and-half, and cream cheese. Whole milk cheeses. Whole-fat or sweetened yogurt. Full-fat cheeses. Nondairy creamers and whipped toppings. Processed cheese, cheese spreads, or cheese curds. Beverages  Alcohol. Sweetened drinks (such as sodas, lemonade, and fruit drinks or punches). Fats and oils  Butter, stick margarine, lard, shortening, ghee, or bacon fat. Coconut, palm kernel, or palm oils. Sweets and desserts  Corn syrup, sugars, honey, and molasses. Candy. Jam and jelly. Syrup. Sweetened cereals. Cookies, pies, cakes, donuts, muffins, and ice cream. The items listed above may not be a complete list of foods and beverages to avoid. Contact your dietitian for more information. This information is not intended to replace advice given to you by your health care provider. Make sure you discuss any questions you have with your health care provider. Document Released: 10/25/2005 Document Revised: 11/15/2014 Document Reviewed: 01/23/2014 Elsevier Interactive Patient Education  2017 [employer] Inc.  

## 2017-05-12 ENCOUNTER — Other Ambulatory Visit: Payer: Self-pay | Admitting: Physician Assistant

## 2017-05-12 DIAGNOSIS — E785 Hyperlipidemia, unspecified: Secondary | ICD-10-CM

## 2017-06-20 ENCOUNTER — Ambulatory Visit: Payer: Self-pay | Admitting: Physician Assistant

## 2017-06-20 ENCOUNTER — Encounter: Payer: Self-pay | Admitting: Physician Assistant

## 2017-06-20 VITALS — BP 116/64 | HR 51 | Temp 97.9°F | Ht 69.0 in | Wt 197.5 lb

## 2017-06-20 DIAGNOSIS — R29898 Other symptoms and signs involving the musculoskeletal system: Secondary | ICD-10-CM
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
## 2017-06-20 NOTE — Patient Instructions (Signed)

## 2017-07-26 ENCOUNTER — Encounter: Payer: Self-pay | Admitting: Physician Assistant

## 2017-07-26 ENCOUNTER — Ambulatory Visit: Payer: Self-pay | Admitting: Physician Assistant

## 2017-07-26 VITALS — BP 98/56 | HR 59 | Temp 97.9°F | Ht 69.0 in | Wt 197.0 lb

## 2017-07-26 DIAGNOSIS — E785 Hyperlipidemia, unspecified: Secondary | ICD-10-CM

## 2017-07-26 DIAGNOSIS — F17210 Nicotine dependence, cigarettes, uncomplicated: Secondary | ICD-10-CM

## 2017-07-26 DIAGNOSIS — R29898 Other symptoms and signs involving the musculoskeletal system: Secondary | ICD-10-CM

## 2017-07-26 MED ORDER — MELOXICAM 15 MG PO TABS: 15.0000 mg | ORAL_TABLET | Freq: Every day | ORAL | 1 refills | Status: DC

## 2017-07-26 NOTE — Progress Notes (Signed)
BP (!) 98/56 (BP Location: Left Arm, Patient Position: Sitting, Cuff Size: Normal)   Pulse (!) 59   Temp 97.9 F (36.6 C)   Ht 5\' 9"  (1.753 m)   Wt 197 lb (89.4 kg)   SpO2 99%   BMI 29.09 kg/m    Subjective:    Patient ID: Jonathon Yates, male    DOB: 1961/09/20, 56 y.o.   MRN: [id number]  HPI: Jonathon Yates is a 56 y.o. male presenting on 07/26/2017 for Hyperlipidemia and Hand Pain   HPI  Pt did not get labs drawn.  He says his phone died is why.  ???  Pt says he did his hand exercises and his hand is still hurting.  His L hand.   Relevant past medical, surgical, family and social history reviewed and updated as indicated. Interim medical history since our last visit reviewed. Allergies and medications reviewed and updated.  No current outpatient prescriptions on file.   Review of Systems  Constitutional: Negative for appetite change, chills, diaphoresis, fatigue, fever and unexpected weight change.  HENT: Negative for congestion, drooling, ear pain, facial swelling, hearing loss, mouth sores, sneezing, sore throat, trouble swallowing and voice change.   Eyes: Negative for pain, discharge, redness, itching and visual disturbance.  Respiratory: Negative for cough, choking, shortness of breath and wheezing.   Cardiovascular: Negative for chest pain, palpitations and leg swelling.  Gastrointestinal: Negative for abdominal pain, blood in stool, constipation, diarrhea and vomiting.  Endocrine: Negative for cold intolerance, heat intolerance and polydipsia.  Genitourinary: Negative for decreased urine volume, dysuria and hematuria.  Musculoskeletal: Positive for arthralgias. Negative for back pain and gait problem.  Skin: Negative for rash.  Allergic/Immunologic: Negative for environmental allergies.  Neurological: Negative for seizures, syncope, light-headedness and headaches.  Hematological: Negative for adenopathy.  Psychiatric/Behavioral: Positive for dysphoric mood.  Negative for agitation and suicidal ideas. The patient is nervous/anxious.     Per HPI unless specifically indicated above     Objective:    BP (!) 98/56 (BP Location: Left Arm, Patient Position: Sitting, Cuff Size: Normal)   Pulse (!) 59   Temp 97.9 F (36.6 C)   Ht 5\' 9"  (1.753 m)   Wt 197 lb (89.4 kg)   SpO2 99%   BMI 29.09 kg/m   Wt Readings from Last 3 Encounters:  07/26/17 197 lb (89.4 kg)  06/20/17 197 lb 8 oz (89.6 kg)  03/23/17 203 lb (92.1 kg)    Physical Exam  Constitutional: He is oriented to person, place, and time. He appears well-developed and well-nourished.  HENT:  Head: Normocephalic and atraumatic.  Neck: Neck supple.  Cardiovascular: Normal rate and regular rhythm.   Pulmonary/Chest: Effort normal and breath sounds normal. He has no wheezes.  Abdominal: Soft. Bowel sounds are normal. There is no hepatosplenomegaly. There is no tenderness.  Musculoskeletal: He exhibits no edema.       Left hand: Normal.  Lymphadenopathy:    He has no cervical adenopathy.  Neurological: He is alert and oriented to person, place, and time.  Skin: Skin is warm and dry.  Psychiatric: He has a normal mood and affect. His behavior is normal.  Vitals reviewed.       Assessment & Plan:   Encounter Diagnoses  Name Primary?  . Hand problems Yes  . Hyperlipidemia, unspecified hyperlipidemia type   . Cigarette nicotine dependence without complication      -pt counseled to Get labs drawn. We will call him with results -rx  mobic for L hand pain -pt to follow up in 4 months.  He is to RTO sooner prn

## 2017-08-10 ENCOUNTER — Other Ambulatory Visit: Payer: Self-pay | Admitting: Physician Assistant

## 2017-08-10 ENCOUNTER — Other Ambulatory Visit (HOSPITAL_COMMUNITY)
Admission: RE | Admit: 2017-08-10 | Discharge: 2017-08-10 | Disposition: A | Discharge status: Home / Self Care | Payer: Self-pay | Source: Ambulatory Visit | Attending: Physician Assistant | Admitting: Physician Assistant

## 2017-08-10 DIAGNOSIS — E785 Hyperlipidemia, unspecified: Secondary | ICD-10-CM

## 2017-08-10 LAB — LIPID PANEL
Cholesterol: 189 mg/dL (ref 0–200)
HDL: 39 mg/dL — ABNORMAL LOW (ref >40)
LDL CALC: 136 mg/dL — AB (ref 0–99)
Total CHOL/HDL Ratio: 4.8 RATIO
Triglycerides: 68 mg/dL (ref <150)
VLDL: 14 mg/dL (ref 0–40)

## 2017-08-10 LAB — HEPATIC FUNCTION PANEL
ALBUMIN: 4.4 g/dL (ref 3.5–5.0)
ALK PHOS: 45 U/L (ref 38–126)
ALT: 10 U/L — AB (ref 17–63)
AST: 15 U/L (ref 15–41)
BILIRUBIN INDIRECT: 1.3 mg/dL — AB (ref 0.3–0.9)
Bilirubin, Direct: 0.2 mg/dL (ref 0.1–0.5)
TOTAL PROTEIN: 7.4 g/dL (ref 6.5–8.1)
Total Bilirubin: 1.5 mg/dL — ABNORMAL HIGH (ref 0.3–1.2)

## 2017-08-10 MED ORDER — SIMVASTATIN 20 MG PO TABS: 20.0000 mg | ORAL_TABLET | Freq: Every day | ORAL | 4 refills | Status: DC

## 2017-08-23 ENCOUNTER — Emergency Department (HOSPITAL_COMMUNITY)
Admission: EM | Admit: 2017-08-23 | Discharge: 2017-08-23 | Disposition: A | Discharge status: Home Health | Payer: Self-pay | Attending: Emergency Medicine | Admitting: Emergency Medicine

## 2017-08-23 ENCOUNTER — Emergency Department (HOSPITAL_COMMUNITY): Payer: Self-pay

## 2017-08-23 ENCOUNTER — Encounter (HOSPITAL_COMMUNITY): Payer: Self-pay | Admitting: *Deleted

## 2017-08-23 DIAGNOSIS — F17210 Nicotine dependence, cigarettes, uncomplicated: Secondary | ICD-10-CM | POA: Insufficient documentation

## 2017-08-23 DIAGNOSIS — M25532 Pain in left wrist: Secondary | ICD-10-CM | POA: Insufficient documentation

## 2017-08-23 HISTORY — DX: Pure hypercholesterolemia, unspecified: E78.00

## 2017-08-23 MED ORDER — DEXAMETHASONE 4 MG PO TABS: 4.0000 mg | ORAL_TABLET | Freq: Two times a day (BID) | ORAL | 0 refills | Status: DC

## 2017-08-23 NOTE — ED Triage Notes (Signed)
Pt c/o left hand pain that radiates up into the elbow x couple of months. Pt also reports whenever he picks something up it falls out of his hand within a few seconds. Pt has been seeing the [facility] and has been taking medication for arthritis but had a reaction to it and had to stop. Pt has another appt [facility] Oct[facility]. Pt reports he cannot wait that long to have his hand looked at.

## 2017-08-23 NOTE — ED Provider Notes (Signed)
[facility] EMERGENCY DEPARTMENT Provider Note   CSN: [id number] Arrival date & time: 08/23/17  1129     History   Chief Complaint Chief Complaint  Patient presents with  . Hand Pain    HPI Jonathon Yates is a 56 y.o. male.  HPI  56 year old male with left hand/wrist pain. Ongoing for weeks. Minimal pain at rest. Severe pain with use of his hands such as gripping. He has been dropping things is not sure if this because of the pain or weakness. The pain extends from his hand and up the ulnar aspect almost the level of the elbow. No redness. No swelling. He says discharge meloxicam but he broke out in a rash with it.  Past Medical History:  Diagnosis Date  . High cholesterol     Patient Active Problem List   Diagnosis Date Noted  . Non-recurrent unilateral inguinal hernia without obstruction or gangrene   . Cigarette nicotine dependence without complication 83/41/9622    Past Surgical History:  Procedure Laterality Date  . INCISE AND DRAIN ABCESS    . INGUINAL HERNIA REPAIR Right 02/25/2017   Procedure: RIGHT INGUINAL HERNIORRHAPHY WITH MESH;  Surgeon: Aviva Signs, MD;  Location: [facility];  Service: General;  Laterality: Right;       Home Medications    Prior to Admission medications   Medication Sig Start Date End Date Taking? Authorizing Provider  dexamethasone (DECADRON) 4 MG tablet Take 1 tablet (4 mg total) by mouth 2 (two) times daily. 08/23/17   Virgel Manifold, MD  meloxicam (MOBIC) 15 MG tablet Take 1 tablet (15 mg total) by mouth daily. 07/26/17   Soyla Dryer, PA-C  simvastatin (ZOCOR) 20 MG tablet Take 1 tablet (20 mg total) by mouth at bedtime. 08/10/17   Soyla Dryer, PA-C    Family History Family History  Problem Relation Age of Onset  . Alzheimer's disease Mother   . Heart disease Father   . Cancer Father     Social History Social History  Substance Use Topics  . Smoking status: Current Every Day Smoker    Packs/day: 0.25    Years:  40.00    Types: Cigarettes  . Smokeless tobacco: Never Used     Comment: has cut back to 1/3 ppd  . Alcohol use No     Comment:  Quit drinking 02/2016.  pt would drinks 6-8 beers twice a  week     Allergies   Patient has no known allergies.   Review of Systems Review of Systems  All systems reviewed and negative, other than as noted in HPI.  Physical Exam Updated Vital Signs BP 129/77 (BP Location: Right Arm)   Pulse 68   Temp 97.9 F (36.6 C) (Oral)   Resp 16   Ht 5\' 9"  (1.753 m)   Wt 88.9 kg (196 lb)   SpO2 100%   BMI 28.94 kg/m   Physical Exam  Constitutional: He appears well-developed and well-nourished. No distress.  HENT:  Head: Normocephalic and atraumatic.  Eyes: Conjunctivae are normal. Right eye exhibits no discharge. Left eye exhibits no discharge.  Neck: Neck supple.  Cardiovascular: Normal rate, regular rhythm and normal heart sounds.  Exam reveals no gallop and no friction rub.   No murmur heard. Pulmonary/Chest: Effort normal and breath sounds normal. No respiratory distress.  Abdominal: Soft. He exhibits no distension. There is no tenderness.  Musculoskeletal: He exhibits tenderness. He exhibits no edema.  Left hand is grossly normal in appearance. No swelling  or concerning skin lesions. Is able to actively range all fingers, thumb hand throughout range of motion, wrist and elbow. He reports the pain is increased with flexion against resistance of second and third fingers. He also has increased pain with passive flexion of the wrist. No point tenderness.  Neurological: He is alert.  Skin: Skin is warm and dry.  Psychiatric: He has a normal mood and affect. His behavior is normal. Thought content normal.  Nursing note and vitals reviewed.    ED Treatments / Results  Labs (all labs ordered are listed, but only abnormal results are displayed) Labs Reviewed - No data to display  EKG  EKG Interpretation None       Radiology Dg Hand Complete  Left  Result Date: 08/23/2017 CLINICAL DATA:  Left hand pain.  No reported injury. EXAM: LEFT HAND - COMPLETE 3+ VIEW COMPARISON:  06/19/2005 left third finger radiographs. FINDINGS: No fracture or dislocation. No suspicious focal osseous lesions. 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 Mild first carpometacarpal joint osteoarthritis. Mild interphalangeal joint left thumb osteoarthritis. Mild scattered osteoarthritis throughout the interphalangeal joints of the second through fifth fingers, most prominent in the distal interphalangeal joint of the left fifth finger. No appreciable joint erosions. No radiopaque foreign bodies. IMPRESSION: Mild polyarticular left hand osteoarthritis. Electronically Signed   By: Ilona Sorrel M.D.   On: 08/23/2017 12:10    Procedures Procedures (including critical care time)  Medications Ordered in ED Medications - No data to display   Initial Impression / Assessment and Plan / ED Course  I have reviewed the triage vital signs and the nursing notes.  Pertinent labs & imaging results that were available during my care of the patient were reviewed by me and considered in my medical decision making (see chart for details).     56yM with hand wrist pain. No external skin changes (swelling, redness, lesion, etc). No acute trauma. Long history of manual labor. I'm not sure as to exact etiology. Overuse tyep injury? Tendonitis. Reports didn't tolerate meloxicam. WIll give course of decadron. I think next step is ortho/hand/sports med eval for persistent symptoms.   Final Clinical Impressions(s) / ED Diagnoses   Final diagnoses:  Left wrist pain    New Prescriptions New Prescriptions   DEXAMETHASONE (DECADRON) 4 MG TABLET    Take 1 tablet (4 mg total) by mouth 2 (two) times daily.     Virgel Manifold, MD 08/24/17 1236

## 2017-08-29 ENCOUNTER — Ambulatory Visit: Payer: Self-pay | Admitting: Physician Assistant

## 2017-08-29 ENCOUNTER — Encounter: Payer: Self-pay | Admitting: Physician Assistant

## 2017-08-29 VITALS — BP 124/70 | HR 67 | Temp 97.9°F | Ht 69.0 in | Wt 197.4 lb

## 2017-08-29 DIAGNOSIS — M79641 Pain in right hand: Secondary | ICD-10-CM

## 2017-08-29 NOTE — Progress Notes (Signed)
BP 124/70 (BP Location: Left Arm, Patient Position: Sitting, Cuff Size: Large)   Pulse 67   Temp 97.9 F (36.6 C) (Other (Comment))   Ht 5\' 9"  (1.753 m)   Wt 197 lb 6.4 oz (89.5 kg)   SpO2 98%   BMI 29.15 kg/m    Subjective:    Patient ID: Jonathon Yates, male    DOB: Mar 12, 1961, 56 y.o.   MRN: [id number]  HPI: Jonathon Yates is a 56 y.o. male presenting on 08/29/2017 for Hand Pain and Wrist Pain   HPI   Pt says mobic gave him rash so he stopped it.  He says it decreased the pain some  Pt complains of same pain he has had past several OV without change.  He went to ER for this recently and had xray.  It was reviewed.   Relevant past medical, surgical, family and social history reviewed and updated as indicated. Interim medical history since our last visit reviewed. Allergies and medications reviewed and updated.   Current Outpatient Prescriptions:  .  dexamethasone (DECADRON) 4 MG tablet, Take 1 tablet (4 mg total) by mouth 2 (two) times daily. (Patient not taking: Reported on 08/29/2017), Disp: 10 tablet, Rfl: 0 .  meloxicam (MOBIC) 15 MG tablet, Take 1 tablet (15 mg total) by mouth daily. (Patient not taking: Reported on 08/29/2017), Disp: 30 tablet, Rfl: 1 .  simvastatin (ZOCOR) 20 MG tablet, Take 1 tablet (20 mg total) by mouth at bedtime. (Patient not taking: Reported on 08/29/2017), Disp: 30 tablet, Rfl: 4   Review of Systems  Constitutional: Negative for appetite change, chills, diaphoresis, fatigue, fever and unexpected weight change.  HENT: Negative for congestion, dental problem, drooling, ear pain, facial swelling, hearing loss, mouth sores, sneezing, sore throat, trouble swallowing and voice change.   Eyes: Negative for pain, discharge, redness, itching and visual disturbance.  Respiratory: Negative for cough, choking, shortness of breath and wheezing.   Cardiovascular: Negative for chest pain, palpitations and leg swelling.  Gastrointestinal: Negative for  abdominal pain, blood in stool, constipation, diarrhea and vomiting.  Endocrine: Negative for cold intolerance, heat intolerance and polydipsia.  Genitourinary: Negative for decreased urine volume, dysuria and hematuria.  Musculoskeletal: Positive for arthralgias. Negative for back pain and gait problem.  Skin: Negative for rash.  Allergic/Immunologic: Negative for environmental allergies.  Neurological: Negative for seizures, syncope, light-headedness and headaches.  Hematological: Negative for adenopathy.  Psychiatric/Behavioral: Positive for dysphoric mood. Negative for agitation and suicidal ideas. The patient is nervous/anxious.     Per HPI unless specifically indicated above     Objective:    BP 124/70 (BP Location: Left Arm, Patient Position: Sitting, Cuff Size: Large)   Pulse 67   Temp 97.9 F (36.6 C) (Other (Comment))   Ht 5\' 9"  (1.753 m)   Wt 197 lb 6.4 oz (89.5 kg)   SpO2 98%   BMI 29.15 kg/m   Wt Readings from Last 3 Encounters:  08/29/17 197 lb 6.4 oz (89.5 kg)  08/23/17 196 lb (88.9 kg)  07/26/17 197 lb (89.4 kg)    Physical Exam  Constitutional: He appears well-developed and well-nourished.  HENT:  Head: Normocephalic and atraumatic.  Pulmonary/Chest: Effort normal.  Musculoskeletal:       Right hand: He exhibits tenderness. He exhibits normal range of motion, no bony tenderness, normal capillary refill, no deformity and no swelling.  R hand with mild diffuse tenderness.  Skin: Skin is warm and dry.  Psychiatric: He has a normal mood  and affect. His behavior is normal.  Nursing note and vitals reviewed.       Assessment & Plan:    Encounter Diagnosis  Name Primary?  . Pain of right hand Yes     -Refer to ortho for hand pain -Gave [facility] application -pt to Follow up january as scheduled

## 2017-09-20 ENCOUNTER — Telehealth: Payer: Self-pay

## 2017-09-20 NOTE — Telephone Encounter (Signed)
Attempted to call for Follow up with [facility]. No answer. Will attempt again at a later time.  Debria Garret RN [phone]

## 2017-11-29 ENCOUNTER — Ambulatory Visit: Payer: Self-pay | Admitting: Physician Assistant

## 2017-12-12 ENCOUNTER — Encounter: Payer: Self-pay | Admitting: Physician Assistant

## 2018-03-28 ENCOUNTER — Encounter (HOSPITAL_COMMUNITY): Payer: Self-pay | Admitting: *Deleted

## 2018-03-28 ENCOUNTER — Emergency Department (HOSPITAL_COMMUNITY)
Admission: EM | Admit: 2018-03-28 | Discharge: 2018-03-28 | Disposition: A | Discharge status: Home / Self Care | Payer: Medicaid Other | Attending: Emergency Medicine | Admitting: Emergency Medicine

## 2018-03-28 ENCOUNTER — Other Ambulatory Visit: Payer: Self-pay

## 2018-03-28 ENCOUNTER — Emergency Department (HOSPITAL_COMMUNITY): Payer: Medicaid Other

## 2018-03-28 DIAGNOSIS — Z79899 Other long term (current) drug therapy: Secondary | ICD-10-CM | POA: Insufficient documentation

## 2018-03-28 DIAGNOSIS — F17210 Nicotine dependence, cigarettes, uncomplicated: Secondary | ICD-10-CM | POA: Diagnosis not present

## 2018-03-28 DIAGNOSIS — Z8673 Personal history of transient ischemic attack (TIA), and cerebral infarction without residual deficits: Secondary | ICD-10-CM | POA: Diagnosis not present

## 2018-03-28 DIAGNOSIS — R531 Weakness: Secondary | ICD-10-CM | POA: Diagnosis not present

## 2018-03-28 DIAGNOSIS — M25512 Pain in left shoulder: Secondary | ICD-10-CM | POA: Diagnosis present

## 2018-03-28 DIAGNOSIS — M25511 Pain in right shoulder: Secondary | ICD-10-CM | POA: Diagnosis not present

## 2018-03-28 HISTORY — DX: Cerebral infarction, unspecified: I63.9

## 2018-03-28 MED ORDER — IBUPROFEN 800 MG PO TABS
800.0000 mg | ORAL_TABLET | Freq: Once | ORAL | Status: AC
  Administered 2018-03-28: 800 mg via ORAL
  Filled 2018-03-28: qty 1

## 2018-03-28 NOTE — NC FL2 (Deleted)
  [place] LEVEL OF CARE SCREENING TOOL     IDENTIFICATION  Patient Name: Jonathon Yates Birthdate: Jan 23, 1961 Sex: male Admission Date (Current Location): 03/28/2018  [facility] and [place] Number:  [place]  Facility and Address:  [place] [address], [place]      Provider Number: [id number]  Attending Physician Name and Address:  Pattricia Boss, MD  Relative Name and Phone Number:       Current Level of Care: Other (Comment)(Patient is in ED.) Recommended Level of Care: [place] Prior Approval Number:    Date Approved/Denied:   PASRR Number:    Discharge Plan: SNF    Current Diagnoses: Patient Active Problem List   Diagnosis Date Noted  . Non-recurrent unilateral inguinal hernia without obstruction or gangrene   . Cigarette nicotine dependence without complication 01/75/1025    Orientation RESPIRATION BLADDER Height & Weight     Self, Time, Situation, Place  Normal Continent Weight: 149 lb (67.6 kg) Height:  5\' 9"  (175.3 cm)  BEHAVIORAL SYMPTOMS/MOOD NEUROLOGICAL BOWEL NUTRITION STATUS      Continent Diet(regular)  AMBULATORY STATUS COMMUNICATION OF NEEDS Skin   Limited Assist Verbally Normal                       Personal Care Assistance Level of Assistance  Bathing, Feeding, Dressing Bathing Assistance: Maximum assistance Feeding assistance: Limited assistance Dressing Assistance: Maximum assistance     Functional Limitations Info  Sight, Hearing, Speech Sight Info: Adequate Hearing Info: Adequate Speech Info: Adequate    SPECIAL CARE FACTORS FREQUENCY                       Contractures Contractures Info: Not present    Additional Factors Info  Code Status, Allergies Code Status Info: Full Code Allergies Info: NKA           Current Medications (03/28/2018):  This is the current hospital active medication list No current facility-administered  medications for this encounter.    Current Outpatient Medications  Medication Sig Dispense Refill  . Aspirin-Salicylamide-Caffeine (BC HEADACHE POWDER PO) Take 1 Package by mouth every 4 (four) hours as needed.       Discharge Medications: Please see discharge summary for a list of discharge medications.  Relevant Imaging Results:  Relevant Lab Results:   Additional Information SSN [id number]. Patient received Medicaid today. His Medicaid worker is [phone].  Jonathon Yates, Clydene Pugh, LCSW

## 2018-03-28 NOTE — ED Notes (Signed)
Report given to [facility],  [facility] contacted to transport pt and reports that it will be after 1900 before they can transport pt

## 2018-03-28 NOTE — ED Notes (Signed)
ED Provider at bedside. 

## 2018-03-28 NOTE — NC FL2 (Signed)
  [place] LEVEL OF CARE SCREENING TOOL     IDENTIFICATION  Patient Name: Jonathon Yates Birthdate: 1961/05/19 Sex: male Admission Date (Current Location): 03/28/2018  [facility] and [place]:  [place]  Facility and Address:  [place] [address] [place]      Provider Number: [id number]  Attending Physician Name and Address:  Pattricia Boss, MD  Relative Name and Phone Number:       Current Level of Care: Other (Comment)(Patient is in ED.) Recommended Level of Care: [place] Prior Approval Number:    Date Approved/Denied:   PASRR Number: [id number] ([id number] )  Discharge Plan: SNF    Current Diagnoses: Patient Active Problem List   Diagnosis Date Noted  . Non-recurrent unilateral inguinal hernia without obstruction or gangrene   . Cigarette nicotine dependence without complication 00/17/4944    Orientation RESPIRATION BLADDER Height & Weight     Self, Time, Situation, Place  Normal Continent Weight: 149 lb (67.6 kg) Height:  5\' 9"  (175.3 cm)  BEHAVIORAL SYMPTOMS/MOOD NEUROLOGICAL BOWEL NUTRITION STATUS      Continent Diet(regular)  AMBULATORY STATUS COMMUNICATION OF NEEDS Skin   Limited Assist Verbally Normal                       Personal Care Assistance Level of Assistance  Bathing, Feeding, Dressing Bathing Assistance: Maximum assistance Feeding assistance: Limited assistance Dressing Assistance: Maximum assistance     Functional Limitations Info  Sight, Hearing, Speech Sight Info: Adequate Hearing Info: Adequate Speech Info: Adequate    SPECIAL CARE FACTORS FREQUENCY                       Contractures Contractures Info: Present(left upper extremity.)    Additional Factors Info  Code Status, Allergies Code Status Info: Full Code Allergies Info: NKA           Current Medications (03/28/2018):  This is the current hospital active medication  list No current facility-administered medications for this encounter.    Current Outpatient Medications  Medication Sig Dispense Refill  . Aspirin-Salicylamide-Caffeine (BC HEADACHE POWDER PO) Take 1 Package by mouth every 4 (four) hours as needed.       Discharge Medications: Please see discharge summary for a list of discharge medications.  Relevant Imaging Results:  Relevant Lab Results:   Additional Information SSN [id number]. Patient received Medicaid today. His Medicaid worker is [phone].  Evian Salguero, Clydene Pugh, LCSW

## 2018-03-28 NOTE — ED Notes (Signed)
Pt returned from xray,  

## 2018-03-28 NOTE — NC FL2 (Deleted)
  [place] LEVEL OF CARE SCREENING TOOL     IDENTIFICATION  Patient Name: Jonathon Yates Birthdate: [birth year] Sex: male Admission Date (Current Location): 03/28/2018  [facility] and [place] Number:  [employer] and Address:  [place] [address], [place]      Provider Number: [id number]  Attending Physician Name and Address:  Pattricia Boss, MD  Relative Name and Phone Number:       Current Level of Care: Other (Comment)(Patient is in ED.) Recommended Level of Care: [place] Prior Approval Number:    Date Approved/Denied:   PASRR Number:    Discharge Plan: SNF    Current Diagnoses: Patient Active Problem List   Diagnosis Date Noted  . Non-recurrent unilateral inguinal hernia without obstruction or gangrene   . Cigarette nicotine dependence without complication 22/12/5425    Orientation RESPIRATION BLADDER Height & Weight     Self, Time, Situation, Place  Normal Continent Weight: 149 lb (67.6 kg) Height:  5\' 9"  (175.3 cm)  BEHAVIORAL SYMPTOMS/MOOD NEUROLOGICAL BOWEL NUTRITION STATUS      Continent Diet(regular)  AMBULATORY STATUS COMMUNICATION OF NEEDS Skin   Limited Assist Verbally Normal                       Personal Care Assistance Level of Assistance  Bathing, Feeding, Dressing Bathing Assistance: Maximum assistance Feeding assistance: Limited assistance Dressing Assistance: Maximum assistance     Functional Limitations Info  Sight, Hearing, Speech Sight Info: Adequate Hearing Info: Adequate Speech Info: Adequate    SPECIAL CARE FACTORS FREQUENCY                       Contractures Contractures Info: Not present    Additional Factors Info  Code Status, Allergies Code Status Info: Full Code Allergies Info: NKA           Current Medications (03/28/2018):  This is the current hospital active medication list No current facility-administered medications for this  encounter.    Current Outpatient Medications  Medication Sig Dispense Refill  . Aspirin-Salicylamide-Caffeine (BC HEADACHE POWDER PO) Take 1 Package by mouth every 4 (four) hours as needed.       Discharge Medications: Please see discharge summary for a list of discharge medications.  Relevant Imaging Results:  Relevant Lab Results:   Additional Information SSN [place]. Patient received Medicaid today. His Medicaid worker is [phone].  Jesalyn Finazzo, Clydene Pugh, LCSW

## 2018-03-28 NOTE — ED Triage Notes (Signed)
Pt c/o bilateral shoulder pain, back and left leg pain that started about 11 months ago, pt has hx of stroke that affected the left side, has very limited movement due to contractures from stroke. Pt denies any injury, ems reports that pt lives by himself but receives assistance from his sister, uses a cane for ambulation.

## 2018-03-28 NOTE — ED Provider Notes (Signed)
[facility] EMERGENCY DEPARTMENT Provider Note   CSN: [id number] Arrival date & time: 03/28/18  1024     History   Chief Complaint Chief Complaint  Patient presents with  . Shoulder Pain    HPI Jonathon Yates is a 57 y.o. male.  HPI 57 year old man history of stroke 1 year ago presents today complaining of decreased ability to care for himself due to shoulder pain and injury.  He states he fell multiple times onto the left shoulder over the past year.  That shoulder has had increased pain and decreased range of motion.  He has now begun to have pain and decreased motion in the right arm.  He denies any new injury to the right arm.  He denies any increased weakness from prior.  He states that he falls frequently due to weakness of the left side of his body.  He was previously homeless but has been staying in a hotel.  He has several siblings and one estranged daughter.  His sister is here with him.  She states that she is unable to have him stay with her.  They both report that he has applied for Medicaid and it should be instituted today. Past Medical History:  Diagnosis Date  . High cholesterol   . Stroke [facility]     Patient Active Problem List   Diagnosis Date Noted  . Non-recurrent unilateral inguinal hernia without obstruction or gangrene   . Cigarette nicotine dependence without complication 84/69/6295    Past Surgical History:  Procedure Laterality Date  . INCISE AND DRAIN ABCESS    . INGUINAL HERNIA REPAIR Right 02/25/2017   Procedure: RIGHT INGUINAL HERNIORRHAPHY WITH MESH;  Surgeon: Aviva Signs, MD;  Location: [facility];  Service: General;  Laterality: Right;        Home Medications    Prior to Admission medications   Medication Sig Start Date End Date Taking? Authorizing Provider  Aspirin-Salicylamide-Caffeine (BC HEADACHE POWDER PO) Take 1 Package by mouth every 4 (four) hours as needed.   Yes [provider]    Family History Family History    Problem Relation Age of Onset  . Alzheimer's disease Mother   . Heart disease Father   . Cancer Father     Social History Social History   Tobacco Use  . Smoking status: Current Every Day Smoker    Packs/day: 0.25    Years: 40.00    Pack years: 10.00    Types: Cigarettes  . Smokeless tobacco: Never Used  . Tobacco comment: has cut back to 1/3 ppd  Substance Use Topics  . Alcohol use: No    Comment:  Quit drinking 02/2016.  pt would drinks 6-8 beers twice a  week  . Drug use: No     Allergies   Patient has no known allergies.   Review of Systems Review of Systems  All other systems reviewed and are negative.    Physical Exam Updated Vital Signs BP 107/77   Pulse 61   Temp 97.6 F (36.4 C) (Oral)   Resp 18   Ht 1.753 m (5\' 9" )   Wt 67.6 kg (149 lb)   SpO2 98%   BMI 22.00 kg/m   Physical Exam  Constitutional: He is oriented to person, place, and time. He appears well-developed.  HENT:  Head: Normocephalic and atraumatic.  Right Ear: External ear normal.  Left Ear: External ear normal.  Mouth/Throat: Oropharynx is clear and moist.  Eyes: Pupils are equal, round, and  reactive to light. EOM are normal.  Neck: Normal range of motion. Neck supple.  Cardiovascular: Normal rate and regular rhythm.  Pulmonary/Chest: Effort normal and breath sounds normal.  Abdominal: Soft. Bowel sounds are normal.  Musculoskeletal: He exhibits no edema.  Bilateral shoulders with decreased range of motion.  No tenderness noted over her left shoulder.  Right shoulder with some diffuse tenderness.  Right external rotation very decreased greater than left.  Radial pulses intact bilaterally.  No external signs of trauma.  Handgrip intact on her right hand decreased strength left hand with contractures noted of left upper extremity  Neurological: He is alert and oriented to person, place, and time.  Left arm contractures decreased strength left lower leg decreased from right lower  leg No edema or trauma noted  Skin: Skin is warm and dry. Capillary refill takes less than 2 seconds.  Psychiatric: He has a normal mood and affect.  Nursing note and vitals reviewed.    ED Treatments / Results  Labs (all labs ordered are listed, but only abnormal results are displayed) Labs Reviewed - No data to display  EKG None  Radiology Dg Shoulder Right  Result Date: 03/28/2018 CLINICAL DATA:  Bilateral shoulder pain for 1 year EXAM: RIGHT SHOULDER - 2+ VIEW COMPARISON:  None. FINDINGS: There is no evidence of fracture or dislocation. There is generalized osteopenia. There is no evidence of arthropathy or other focal bone abnormality. Soft tissues are unremarkable. IMPRESSION: No acute osseous injury of the right shoulder. Electronically Signed   By: Kathreen Devoid   On: 03/28/2018 11:12    Procedures Procedures (including critical care time)  Medications Ordered in ED Medications - No data to display   Initial Impression / Assessment and Plan / ED Course  I have reviewed the triage vital signs and the nursing notes.  Pertinent labs & imaging results that were available during my care of the patient were reviewed by me and considered in my medical decision making (see chart for details).     57 year old male with clinical adhesive capsulitis bilaterally.  This combined with his left-sided weakness has had difficulty in caring for himself.  Social work has been consulted and are evaluating patient.  SW saw patient and attempting placement at [facility] Final Clinical Impressions(s) / ED Diagnoses   Final diagnoses:  Bilateral shoulder pain, unspecified chronicity  Left-sided weakness    ED Discharge Orders    None       Pattricia Boss, MD 03/28/18 [phone]

## 2018-03-28 NOTE — Clinical Social Work Note (Signed)
Patient and sister report that patient has been living in a hotel with sister's son in law paying for it.  Sister indicates that it is becoming increasing difficult for patient to care for himself due to issues from a past stroke. Per attending's note patient has decreased range of motin with his bilateral shoulders, diffuse tenderness in his right shoulder, decreased strength with his left hand with contractures noted of left upper extremity.    LCSW faxed FL2 to several facilities.     Tawney Vanorman, Clydene Pugh, LCSW

## 2018-03-28 NOTE — NC FL2 (Deleted)
  [place] LEVEL OF CARE SCREENING TOOL     IDENTIFICATION  Patient Name: Jonathon Yates Birthdate: 07-20-1961 Sex: male Admission Date (Current Location): 03/28/2018  [facility] and [place] Number:  [place]  Facility and Address:  [place] [address] [place]      Provider Number: [id number]  Attending Physician Name and Address:  Pattricia Boss, MD  Relative Name and Phone Number:       Current Level of Care: Other (Comment)(Patient is in ED.) Recommended Level of Care: [place] Prior Approval Number:    Date Approved/Denied:   PASRR Number:    Discharge Plan: SNF    Current Diagnoses: Patient Active Problem List   Diagnosis Date Noted  . Non-recurrent unilateral inguinal hernia without obstruction or gangrene   . Cigarette nicotine dependence without complication 01/27/2247    Orientation RESPIRATION BLADDER Height & Weight     Self, Time, Situation, Place  Normal Continent Weight: 149 lb (67.6 kg) Height:  5\' 9"  (175.3 cm)  BEHAVIORAL SYMPTOMS/MOOD NEUROLOGICAL BOWEL NUTRITION STATUS      Continent Diet(regular)  AMBULATORY STATUS COMMUNICATION OF NEEDS Skin   Limited Assist Verbally Normal                       Personal Care Assistance Level of Assistance  Bathing, Feeding, Dressing Bathing Assistance: Maximum assistance Feeding assistance: Limited assistance Dressing Assistance: Maximum assistance     Functional Limitations Info  Sight, Hearing, Speech Sight Info: Adequate Hearing Info: Adequate Speech Info: Adequate    SPECIAL CARE FACTORS FREQUENCY                       Contractures Contractures Info: Present(left upper extremity.)    Additional Factors Info  Code Status, Allergies Code Status Info: Full Code Allergies Info: NKA           Current Medications (03/28/2018):  This is the current hospital active medication list No current  facility-administered medications for this encounter.    Current Outpatient Medications  Medication Sig Dispense Refill  . Aspirin-Salicylamide-Caffeine (BC HEADACHE POWDER PO) Take 1 Package by mouth every 4 (four) hours as needed.       Discharge Medications: Please see discharge summary for a list of discharge medications.  Relevant Imaging Results:  Relevant Lab Results:   Additional Information SSN [id number]. Patient received Medicaid today. His Medicaid worker is [phone].  Lewanna Petrak, Clydene Pugh, LCSW

## 2018-03-28 NOTE — ED Notes (Signed)
Pt given peanut butter & crackers. 

## 2018-10-17 ENCOUNTER — Other Ambulatory Visit: Payer: Self-pay | Admitting: Neurology

## 2018-10-17 DIAGNOSIS — R269 Unspecified abnormalities of gait and mobility: Secondary | ICD-10-CM

## 2018-11-03 ENCOUNTER — Ambulatory Visit (HOSPITAL_COMMUNITY)
Admission: RE | Admit: 2018-11-03 | Discharge: 2018-11-03 | Disposition: A | Discharge status: Home / Self Care | Payer: Medicaid Other | Source: Ambulatory Visit | Attending: Neurology | Admitting: Neurology

## 2018-11-03 DIAGNOSIS — M4802 Spinal stenosis, cervical region: Secondary | ICD-10-CM | POA: Diagnosis not present

## 2018-11-03 DIAGNOSIS — R269 Unspecified abnormalities of gait and mobility: Secondary | ICD-10-CM | POA: Diagnosis not present

## 2018-11-03 DIAGNOSIS — M47812 Spondylosis without myelopathy or radiculopathy, cervical region: Secondary | ICD-10-CM | POA: Diagnosis not present

## 2019-03-09 DEATH — deceased

## 2019-10-07 IMAGING — MR MR HEAD W/O CM
6 of 10 series · 30 of 48 positions shown · non-contrast
Comparison: None.

CLINICAL DATA: Impaired gait.  Weakness.

EXAM:
MRI HEAD WITHOUT CONTRAST
TECHNIQUE: Multiplanar, multiecho pulse sequences of the brain and surrounding
structures were obtained without intravenous contrast.

[Series 1: DWI · coronal · 5.0mm · 0.48mm/px · 4 of 38 slices shown (1 of 4)]
[im 1/38]
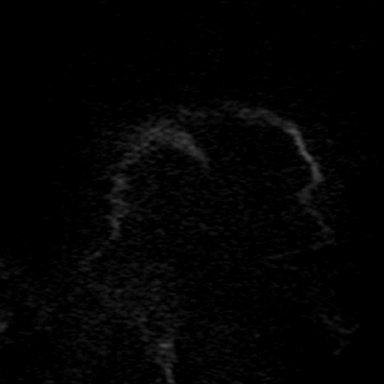
[im 13/38]
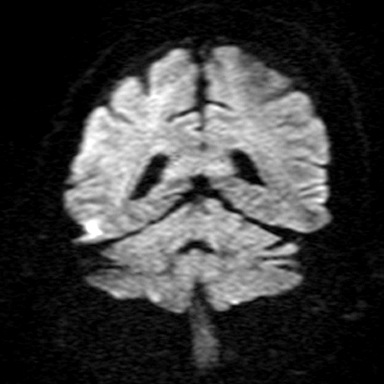
[im 25/38]
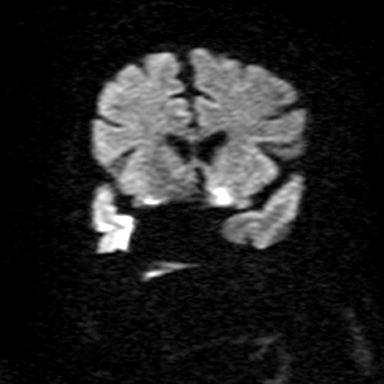
[im 38/38]
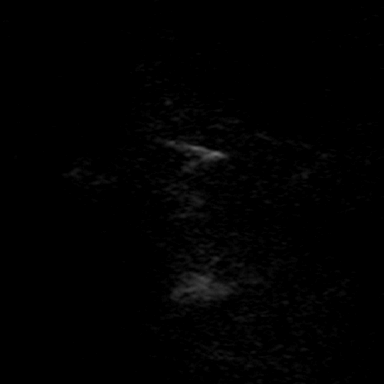

[Series 2: DWI · coronal · 5.0mm · 0.54mm/px · 4 of 38 slices shown (2 of 4)]
[im 1/38]
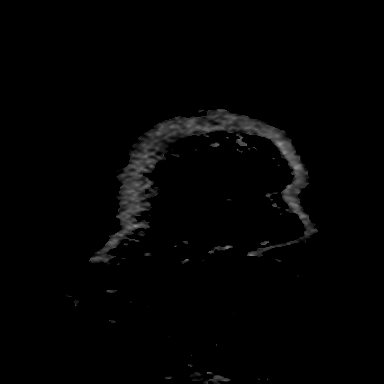
[im 13/38]
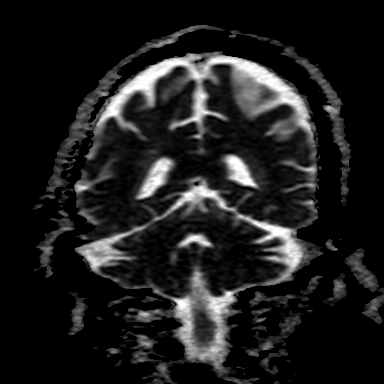
[im 25/38]
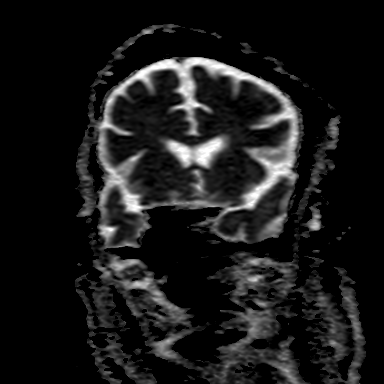
[im 38/38]
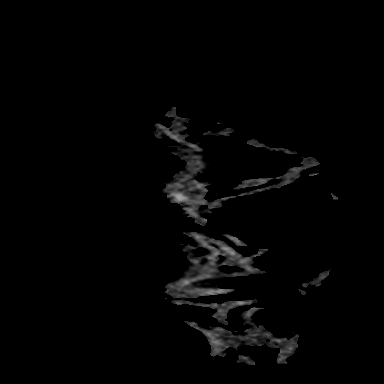

[Series 4: T2 · axial · 5.0mm · 0.58mm/px · z∈[-30,+92]mm · 3 of 23 slices shown]
[im 1/23]
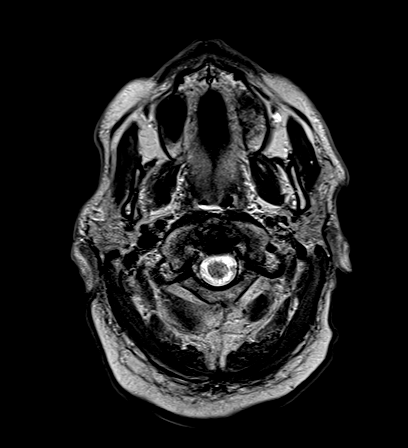
[im 12/23]
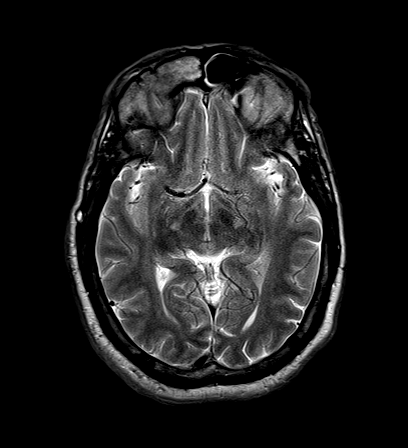
[im 23/23]
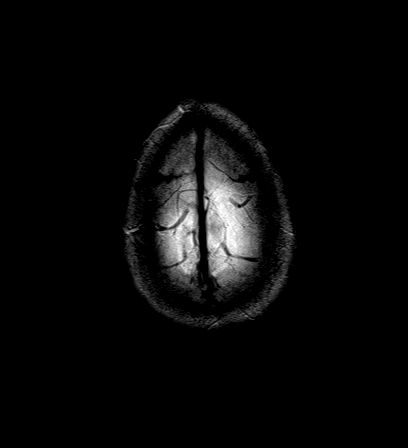

[Series 5: FLAIR · axial · 3.0mm · 0.37mm/px · z∈[-40,+91]mm · 6 of 52 slices shown]
[im 1/52]
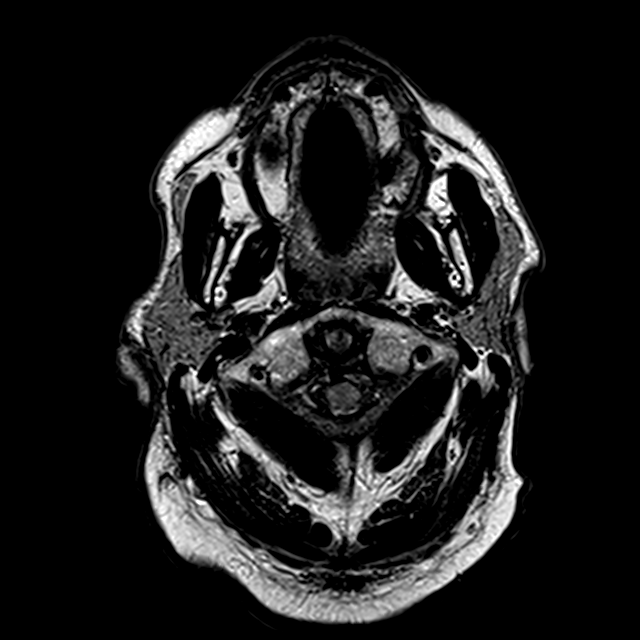
[im 11/52]
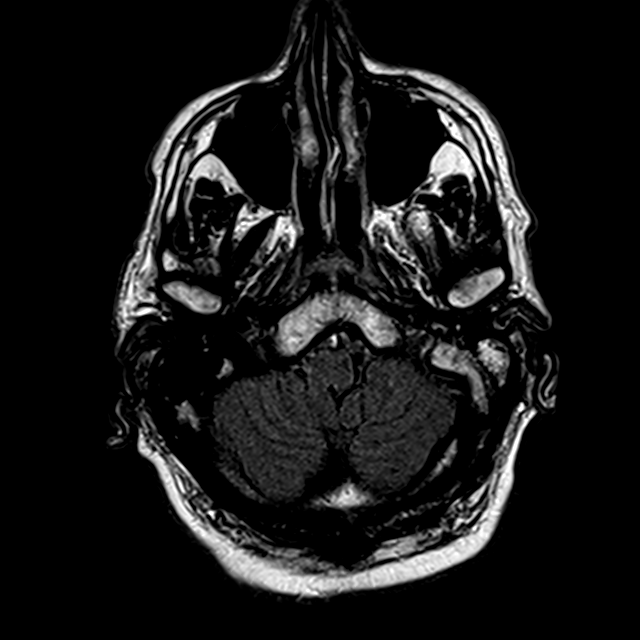
[im 21/52]
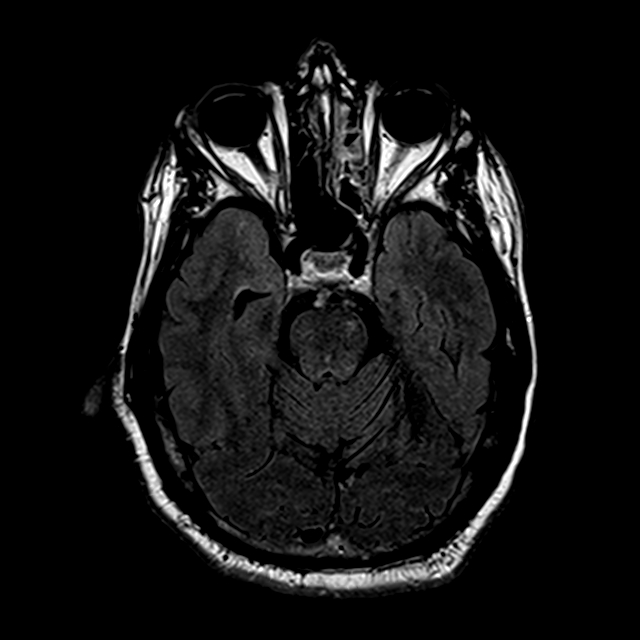
[im 31/52]
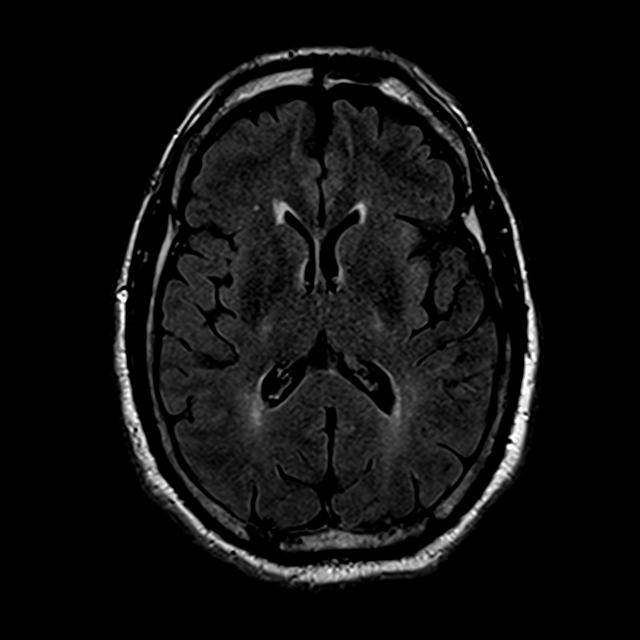
[im 41/52]
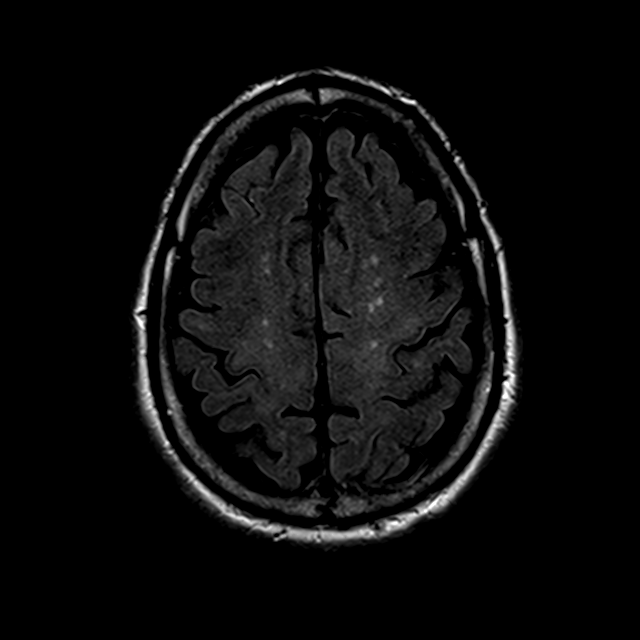
[im 52/52]
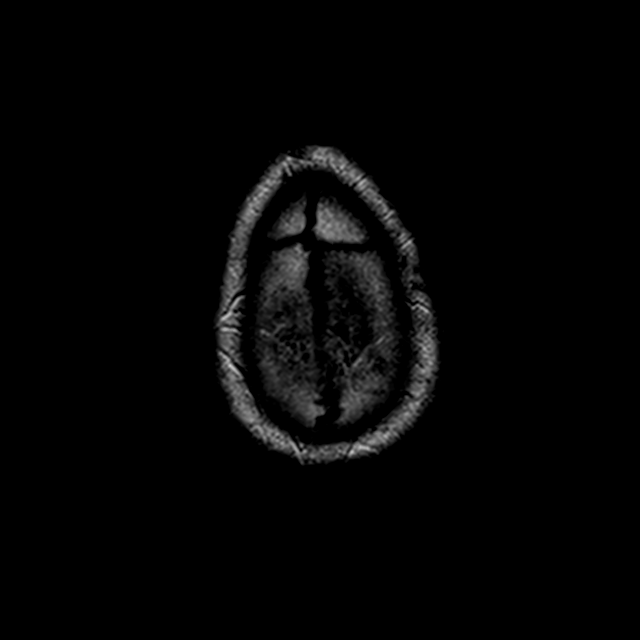

[Series 9: DWI · axial · 3.0mm · 0.75mm/px · z∈[-51,+87]mm · 7 of 55 slices shown (3 of 4)]
[im 1/55]
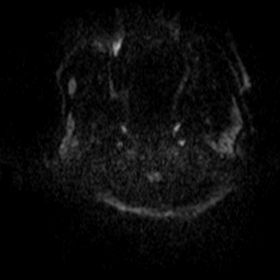
[im 10/55]
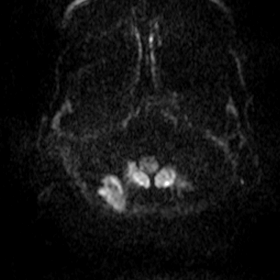
[im 19/55]
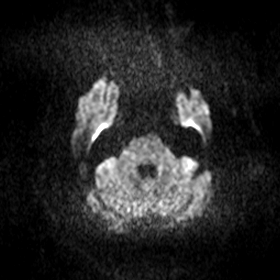
[im 28/55]
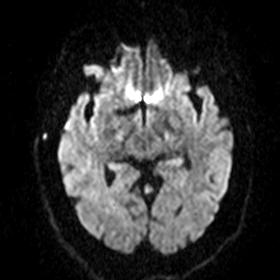
[im 37/55]
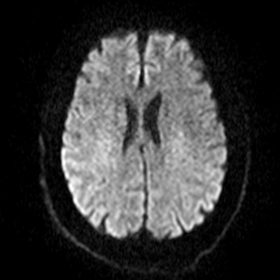
[im 46/55]
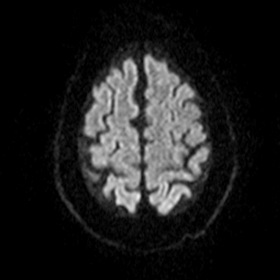
[im 55/55]
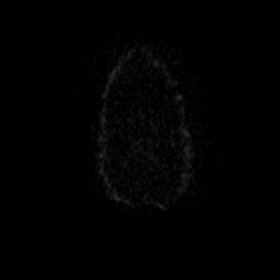

[Series 10: DWI · axial · 3.0mm · 0.75mm/px · z∈[-51,+87]mm · 6 of 54 slices shown (4 of 4)]
[im 1/54]
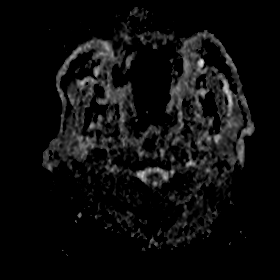
[im 11/54]
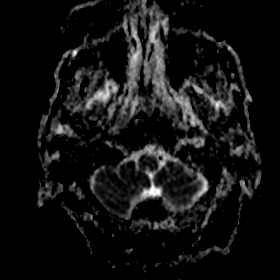
[im 22/54]
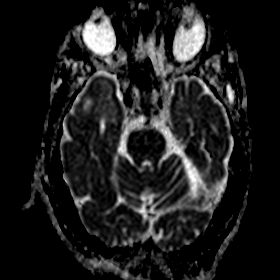
[im 32/54]
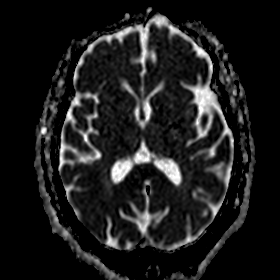
[im 43/54]
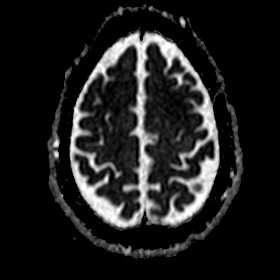
[im 54/54]
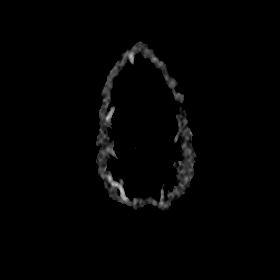

[30 of 48 positions shown; findings below may reference images not displayed]

FINDINGS: Brain: There is symmetric prominent FLAIR signal within the cortical
spinal tracts at the level of the posterior limb internal capsule
and pons. This pontine signal is discrete and thin appearing on
sagittal T2 weighted imaging of the cervical spine performed at the
same time. There is no apparent cortical thinning. There is visible
iron deposition along the precentral gyrus on gradient imaging,
nonspecific but potentially prominent for age and this scanner.
Brain volume is normal. There is minor presumed chronic
microvascular ischemic type change in the cerebral white matter. No
hydrocephalus, collection, or masslike finding.

Vascular: Major flow voids are preserved

Skull and upper cervical spine: Negative for marrow lesion

Sinuses/Orbits: Mucosal thickening in left mastoid air cells.
IMPRESSION: Symmetric prominent signal within the lower cortical spinal
radiations, question ALS or other neurodegenerative process.
